# Patient Record
Sex: Female | Born: 1950 | Race: Black or African American | Hispanic: No | Smoking: Never smoker
Health system: Southern US, Community
[De-identification: ages and names within clinical notes are randomized; demographics above are authoritative.]

## PROBLEM LIST (undated history)

## (undated) DIAGNOSIS — M1990 Unspecified osteoarthritis, unspecified site: Secondary | ICD-10-CM

## (undated) DIAGNOSIS — C50919 Malignant neoplasm of unspecified site of unspecified female breast: Secondary | ICD-10-CM

## (undated) DIAGNOSIS — E039 Hypothyroidism, unspecified: Secondary | ICD-10-CM

## (undated) DIAGNOSIS — Z9221 Personal history of antineoplastic chemotherapy: Secondary | ICD-10-CM

## (undated) DIAGNOSIS — I10 Essential (primary) hypertension: Secondary | ICD-10-CM

## (undated) DIAGNOSIS — I519 Heart disease, unspecified: Secondary | ICD-10-CM

## (undated) DIAGNOSIS — E7800 Pure hypercholesterolemia, unspecified: Secondary | ICD-10-CM

## (undated) DIAGNOSIS — Z923 Personal history of irradiation: Secondary | ICD-10-CM

## (undated) HISTORY — DX: Heart disease, unspecified: I51.9

## (undated) HISTORY — DX: Malignant neoplasm of unspecified site of unspecified female breast: C50.919

## (undated) HISTORY — DX: Pure hypercholesterolemia, unspecified: E78.00

## (undated) HISTORY — PX: COLONOSCOPY: SHX174

---

## 1998-08-11 ENCOUNTER — Other Ambulatory Visit: Admission: RE | Admit: 1998-08-11 | Discharge: 1998-08-11 | Payer: Self-pay | Admitting: Obstetrics and Gynecology

## 1999-03-13 HISTORY — PX: BREAST LUMPECTOMY: SHX2

## 1999-08-16 ENCOUNTER — Encounter (HOSPITAL_BASED_OUTPATIENT_CLINIC_OR_DEPARTMENT_OTHER): Payer: Self-pay | Admitting: General Surgery

## 1999-08-18 ENCOUNTER — Ambulatory Visit (HOSPITAL_COMMUNITY): Admission: RE | Admit: 1999-08-18 | Discharge: 1999-08-18 | Payer: Self-pay | Admitting: General Surgery

## 1999-08-18 ENCOUNTER — Encounter (INDEPENDENT_AMBULATORY_CARE_PROVIDER_SITE_OTHER): Payer: Self-pay | Admitting: Specialist

## 1999-08-21 ENCOUNTER — Other Ambulatory Visit: Admission: RE | Admit: 1999-08-21 | Discharge: 1999-08-21 | Payer: Self-pay | Admitting: General Surgery

## 1999-09-06 ENCOUNTER — Encounter (INDEPENDENT_AMBULATORY_CARE_PROVIDER_SITE_OTHER): Payer: Self-pay | Admitting: *Deleted

## 1999-09-06 ENCOUNTER — Ambulatory Visit (HOSPITAL_COMMUNITY): Admission: RE | Admit: 1999-09-06 | Discharge: 1999-09-06 | Payer: Self-pay | Admitting: General Surgery

## 1999-10-10 ENCOUNTER — Encounter (HOSPITAL_BASED_OUTPATIENT_CLINIC_OR_DEPARTMENT_OTHER): Payer: Self-pay | Admitting: General Surgery

## 1999-10-10 ENCOUNTER — Ambulatory Visit (HOSPITAL_COMMUNITY): Admission: RE | Admit: 1999-10-10 | Discharge: 1999-10-10 | Payer: Self-pay | Admitting: General Surgery

## 1999-12-19 ENCOUNTER — Ambulatory Visit (HOSPITAL_COMMUNITY): Admission: RE | Admit: 1999-12-19 | Discharge: 1999-12-19 | Payer: Self-pay | Admitting: General Surgery

## 2000-01-04 ENCOUNTER — Encounter: Admission: RE | Admit: 2000-01-04 | Discharge: 2000-04-03 | Payer: Self-pay | Admitting: Radiation Oncology

## 2000-08-22 ENCOUNTER — Other Ambulatory Visit: Admission: RE | Admit: 2000-08-22 | Discharge: 2000-08-22 | Payer: Self-pay | Admitting: Obstetrics and Gynecology

## 2000-11-27 ENCOUNTER — Ambulatory Visit (HOSPITAL_COMMUNITY): Admission: RE | Admit: 2000-11-27 | Discharge: 2000-11-27 | Payer: Self-pay | Admitting: Gastroenterology

## 2002-10-20 ENCOUNTER — Encounter: Admission: RE | Admit: 2002-10-20 | Discharge: 2002-10-20 | Payer: Self-pay | Admitting: Oncology

## 2002-10-20 ENCOUNTER — Encounter: Payer: Self-pay | Admitting: Oncology

## 2003-03-13 HISTORY — PX: MENISCUS REPAIR: SHX5179

## 2003-05-20 ENCOUNTER — Ambulatory Visit (HOSPITAL_COMMUNITY): Admission: RE | Admit: 2003-05-20 | Discharge: 2003-05-20 | Payer: Self-pay | Admitting: Cardiovascular Disease

## 2003-06-22 ENCOUNTER — Encounter: Admission: RE | Admit: 2003-06-22 | Discharge: 2003-06-22 | Payer: Self-pay | Admitting: Cardiovascular Disease

## 2003-07-06 ENCOUNTER — Encounter: Admission: RE | Admit: 2003-07-06 | Discharge: 2003-07-06 | Payer: Self-pay | Admitting: Oncology

## 2004-06-16 ENCOUNTER — Ambulatory Visit: Payer: Self-pay | Admitting: Oncology

## 2004-07-20 ENCOUNTER — Encounter: Admission: RE | Admit: 2004-07-20 | Discharge: 2004-07-20 | Payer: Self-pay | Admitting: Obstetrics and Gynecology

## 2004-09-01 ENCOUNTER — Ambulatory Visit (HOSPITAL_COMMUNITY): Admission: RE | Admit: 2004-09-01 | Discharge: 2004-09-01 | Payer: Self-pay | Admitting: Obstetrics and Gynecology

## 2004-09-01 ENCOUNTER — Encounter (INDEPENDENT_AMBULATORY_CARE_PROVIDER_SITE_OTHER): Payer: Self-pay | Admitting: Specialist

## 2005-04-16 ENCOUNTER — Ambulatory Visit: Payer: Self-pay | Admitting: Oncology

## 2005-06-12 ENCOUNTER — Ambulatory Visit: Payer: Self-pay | Admitting: Oncology

## 2005-08-30 ENCOUNTER — Encounter: Admission: RE | Admit: 2005-08-30 | Discharge: 2005-08-30 | Payer: Self-pay | Admitting: Obstetrics and Gynecology

## 2006-09-02 ENCOUNTER — Encounter: Admission: RE | Admit: 2006-09-02 | Discharge: 2006-09-02 | Payer: Self-pay | Admitting: General Surgery

## 2007-09-10 ENCOUNTER — Encounter: Admission: RE | Admit: 2007-09-10 | Discharge: 2007-09-10 | Payer: Self-pay | Admitting: Cardiovascular Disease

## 2007-10-19 ENCOUNTER — Encounter: Admission: RE | Admit: 2007-10-19 | Discharge: 2007-10-19 | Payer: Self-pay | Admitting: Cardiovascular Disease

## 2008-09-14 ENCOUNTER — Encounter: Admission: RE | Admit: 2008-09-14 | Discharge: 2008-09-14 | Payer: Self-pay | Admitting: Cardiovascular Disease

## 2009-03-09 ENCOUNTER — Ambulatory Visit (HOSPITAL_COMMUNITY): Admission: RE | Admit: 2009-03-09 | Discharge: 2009-03-09 | Payer: Self-pay | Admitting: Obstetrics and Gynecology

## 2009-09-21 ENCOUNTER — Encounter: Admission: RE | Admit: 2009-09-21 | Discharge: 2009-09-21 | Payer: Self-pay | Admitting: Obstetrics and Gynecology

## 2009-12-19 ENCOUNTER — Encounter: Admission: RE | Admit: 2009-12-19 | Discharge: 2009-12-19 | Payer: Self-pay | Admitting: Obstetrics and Gynecology

## 2010-05-23 ENCOUNTER — Other Ambulatory Visit: Payer: Self-pay | Admitting: Obstetrics and Gynecology

## 2010-06-12 LAB — CBC
HCT: 42.3 % (ref 36.0–46.0)
Hemoglobin: 14 g/dL (ref 12.0–15.0)
MCV: 89.3 fL (ref 78.0–100.0)
Platelets: 198 10*3/uL (ref 150–400)
WBC: 5.4 10*3/uL (ref 4.0–10.5)

## 2010-07-28 NOTE — Procedures (Signed)
. [facility]  Patient:    Rebecca Morgan, Rebecca Morgan Visit Number: [id number] MRN: [id number]          Service Type: END Location: ENDO Attending Physician:  Charna Elizabeth Dictated by:   Anselmo Rod, M.D. Proc. Date: 11/27/00 Admit Date:  11/27/2000   CC:         Ricki Rodriguez, M.D.   Procedure Report  DATE OF BIRTH:  June 10, 1950.  PROCEDURE:  Colonoscopy.  ENDOSCOPIST:  Anselmo Rod, M.D.  INSTRUMENT USED:  Olympus video colonoscope.  INDICATION FOR PROCEDURE:  A 60 year old African-American female with a personal history of breast cancer.  Rule out colonic polyps, masses, hemorrhoids, etc.  The patient has guaiac-positive stools.  PREPROCEDURE PREPARATION:  Informed consent was procured from the patient. The patient was fasted for eight hours prior to the procedure and prepped with a bottle of magnesium citrate and a gallon of NuLytely the night prior to the procedure.  PREPROCEDURE PHYSICAL:  VITAL SIGNS:  The patient had stable vital signs.  NECK:  Supple.  CHEST:  Clear to auscultation.  S1, S2 regular.  ABDOMEN:  Soft with normal bowel sounds.  DESCRIPTION OF PROCEDURE:  The patient was placed in the left lateral decubitus position and sedated with 75 mg of Demerol and 7.5 mg of Versed intravenously.  Once the patient was adequately sedate and maintained on low-flow oxygen and continuous cardiac monitoring, the Olympus video colonoscope was advanced from the rectum to the cecum without difficulty. Except for a few early left-sided diverticula, no other masses were seen.  The patient tolerated the procedure well without complications.  The appendiceal orifice and the ileocecal valve were clearly visualized and photographed.  IMPRESSION:  Healthy-appearing colon except for a few early left-sided diverticula.  RECOMMENDATIONS: 1. Repeat guaiac testing will be done on an outpatient basis and    recommendations made  thereafter. 2. High-fiber diet with liberal fluid intake has been advocated.  Brochures    on diverticulosis have been handed to the patient for her education. 3. Considering her personal history of breast cancer, repeat colorectal cancer    screening is recommended in the next five years unless the patient were to    develop an abnormal symptoms in the interim. Dictated by:   Anselmo Rod, M.D. Attending Physician:  Charna Elizabeth DD:  11/27/00 TD:  11/27/00 Job: [id number]

## 2010-07-28 NOTE — Op Note (Signed)
[place]. [facility]  Patient:    Rebecca Morgan, Rebecca Morgan                         MRN: [id number] Proc. Date: 09/06/99 Adm. Date:  [id number] Disc. Date: [id number] Attending:  Sonda Primes                           Operative Report  PREOPERATIVE DIAGNOSIS:  Carcinoma of the right breast.  POSTOPERATIVE DIAGNOSIS:   Carcinoma of the right breast.  PROCEDURE:  Sentinel lymph node dissection.  SURGEON:  Mardene Celeste. Lurene Shadow, M.D.  ASSISTANT:  ANESTHESIA:  General.  INDICATIONS:  This patient is a 60 year old woman who underwent excision of a right breast mass with margins.  Pathology shows that this is a carcinoma measuring approximately 2.5 cm.  Margins are widely free of tumor.  She is returned to the operating room now following radionuclide injection into the previous biopsy site and injection of Lymphazurin dye here in the operating room for sentinel lymph node dissection.  DESCRIPTION OF PROCEDURE:  Following the induction of anesthesia with the patient positioned supinely, the right axilla and breast are prepped and draped to be included in the sterile operative field.  Using the needle probe to locate an area just at the right axillary line where the counts were highest, I made a transverse incision across the axilla, deepened this through the skin and subcutaneous tissues, using the needle probe to guide the way to the radioactivity.  In the area dissected, there was a very large, blue, hot lymph node which was dissected free using electrocautery and clips where appropriate.  There were three separate nodes forwarded for pathologic evaluation, #1, 2, and 3.  All of these on touch prep were noted to be free of tumor.  Hemostasis was assured within the axilla.  The sponge, instrument, and sharp counts were verified.  The subcutaneous tissues were closed with interrupted 3-0 Vicryl suture.  The skin was closed with 4-0 Monocryl suture and then  reinforced with Steri-Strips.  Sterile dressings were applied. Anesthetic reversed.  The patient was removed from the operating room to the recovery room in stable condition, having tolerated the procedure well. DD:  09/06/99 TD:  09/07/99 Job: [id number]

## 2010-07-28 NOTE — Op Note (Signed)
[place]. [facility]  Patient:    NAYLEE, FRANKOWSKI                         MRN: [id number] Proc. Date: 08/18/99 Adm. Date:  [id number] Disc. Date: [id number] Attending:  Sonda Primes CC:         Mardene Celeste. Lurene Shadow, M.D. (2)                           Operative Report  PREOPERATIVE DIAGNOSIS:  Right breast mass.  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  OPERATION PERFORMED:  Excisional biopsy right breast mass followed by lumpectomy of right breast.  SURGEON:  Luisa Hart L. Lurene Shadow, M.D.  ASSISTANTRubye Oaks,  PA student.  ANESTHESIA:  General.  INDICATIONS FOR PROCEDURE:  The patient is a 60 year old woman presenting with a palpable right breast mass which is not imaged on mammogram or ultrasound. She is brought to the operating room for excisional biopsy.  DESCRIPTION OF PROCEDURE:  Following the induction of satisfactory anesthesia, the patient was positioned supinely and the right breast prepped and draped to be included in a sterile operative field.  A circumareolar incision was made on the inferior and lateral portion of the areolar border, deepened through the skin and subcutaneous tissues with dissection carried somewhat inferiorly and laterally to the mass.  The mass was dissected free and forwarded for pathologic evaluation.  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  The patient was prepared with extension of the incisions both inferiorly and superiorly and a lumpectomy taking wide margins around the biopsy cavity down to the chest wall were taken.  Lumpectomy specimen marked with sutures for orientation.  Single long suture medial portion, double suture inferior portion and four sutures on the anterior margin of the lumpectomy.  This was forwarded for pathologic evaluation. Pathology is pending.  Hemostasis was assured throughout the breast tissues. Sponge, instrument and  sharp counts were verified.  Subcutaneous tissues closed with interrupted 3-0 Vicryl sutures and skin closed with 5-0 Monocryl running subcuticular stitches, then reinforced with Steri-Strips.  Sterile dressings applied.  Anesthetic reversed.  Patient removed from the operating room to the recovery room in stable condition having tolerated the procedure well.DD:  08/18/99 TD:  08/22/99 Job: [id number]

## 2010-07-28 NOTE — Op Note (Signed)
[place]. [facility]  Patient:    Rebecca Morgan, Rebecca Morgan                         MRN: [id number] Proc. Date: 10/10/99 Attending:  Luisa Hart L. Lurene Shadow, M.D. CC:         Mardene Celeste. Lurene Shadow, M.D. 2 copies                           Operative Report  PREOPERATIVE DIAGNOSIS:  Poor venous access status post breast cancer for chemotherapy.  POSTOPERATIVE DIAGNOSIS:  Poor venous access status post breast cancer for chemotherapy.  PROCEDURE:  Implantation of Port-A-Cath.  SURGEON:  Dr. Lurene Shadow.  ASSISTANT:  Nurse.  ANESTHESIA:  MAC.  I used 1% Xylocaine with and without epinephrine.  HISTORY OF PRESENT ILLNESS:  This patient is a 60 year old woman being prepared for treatment for advanced breast cancer who has poor venous access. She is brought to the operating room now for implantation of venous access device.  DESCRIPTION OF PROCEDURE:  Following the induction of satisfactory sedation, with the patient positioned in Trendelenburg position, the anterior neck and chest were prepped and draped to be included in the sterile operative field. Infiltrated the left subclavian region with 1% Xylocaine plain, and a subclavian stick was made into the left subclavian vein, and a guide wire is threaded into the central venous system and positioned in the atrium under fluoroscopic guidance.  I then created a pocket on the anterior chest wall, and from the pocket created a tunnel up to the shoulder wound.  A Silastic catheter was pulled through the tunnel and flushed.  I used a size 10 French Cook introducer dilator to access the central venous system over the guide wire.  The dilator and guide wire were removed, and the Silastic catheter was then placed into the central venous system through the [facility] introducer, and positioned at the atrial vena caval junction.  Inflow of heparinized saline and then blood returned and was noted to be excellent.  The exterior portion of the Silastic  catheter was then cut, and the reservoir was flushed and attached to the Silastic catheter.  Inflow of heparinized saline and blood return were noted to be excellent through the reservoir.  The reservoir was then positioned in the pocket, and then sutured in place with interrupted 2-0 silk sutures.  Sponge, instrument, and Sharps counts were then verified. Final positioning fluoroscopically showed the reservoir to be well suited.  No kinks, bends, or turns within the course of the Silastic tubing, and the tip of the tubing was at the atrial vena caval junction.  Subcutaneous tissues of the wound were then closed with interrupted 3-0 Vicryl sutures, both the pocket and the shoulder wound, and the skin wounds were both closed with a running 4-0 Monocryl suture.  The wounds were reinforced with Steri-Strips, and a sterile dressing applied.  Anesthetic reversed, and the patient removed from the operating room to the recovery room in stable condition, having tolerated the procedure well.DD:  10/10/99 TD:  10/10/99 Job: [id number]

## 2010-07-28 NOTE — Op Note (Signed)
NAME:  Rebecca Morgan, Rebecca Morgan                ACCOUNT NO.:  [id number]   MEDICAL RECORD NO.:  [id number]          PATIENT TYPE:  AMB   LOCATION:  [facility]                           FACILITY:  [facility]   PHYSICIAN:  Maxie Better, M.D.DATE OF BIRTH:  1950/12/31   DATE OF PROCEDURE:  09/01/2004  DATE OF DISCHARGE:                                 OPERATIVE REPORT   PREOPERATIVE DIAGNOSES:  1.  Postmenopausal bleeding.  2.  Post coital bleeding.  3.  Endometrial mass.   OPERATION/PROCEDURE:  1.  Fractional dilatation and curettage.  2.  Diagnostic hysteroscopy.  3.  Resection of submucosal fibroids.   POSTOPERATIVE DIAGNOSES:  1.  Submucosal fibroid.  2.  Post coital bleeding.   ANESTHESIA:  General.   SURGEON:  Maxie Better, M.D.   INDICATIONS:  This is a 60 year old, para 2, postmenopausal female with a  history of right breast cancer on tamoxifen therapy who presented with  complaint of post coital bleeding, and who, on evaluation with a  sonohysterogram and ultrasound, was found to have a hyperechoic mass  posteriorly.  The patient now presents for surgical management.  She is  known to have additional uterine fibroids by ultrasound and endometrial  stripe at been 7.2  mm prior to the sonohysterogram.  The risks and benefits  of the procedure had been explained to the patient.  The patient was  transferred to the operating room.   DESCRIPTION OF PROCEDURE:  Under adequate general anesthesia, the patient  was placed in the dorsal lithotomy position.  She was sterilely prepped and  draped in the usual fashion.  Bladder was catheterized for a moderate amount  of urine.  Examination under anesthesia revealed an irregular 10-week size  uterus.  No adnexal masses could be appreciated but limited by the patient's  body habitus.   Speculum was placed in the vagina.  Single-tooth tenaculum was placed no the  anterior lip of the cervix.  The endocervical canal was then curetted for  specimen of simple curettage/curettings.  The cervix was then serially  dilated up to #31 [facility] dilator. A sorbitol prime resectoscope was  introduced into the uterine cavity without incident.  On inspection, there  was a posterior mass noted close to the lower uterine segment.  Both tubal  ostia could be seen.  The double loop was then used to resect the mass  which was found to be consistent with a submucosal fibroid.  On further  inspection of the endometrial cavity, there was a polypoid lesion extending  from the left tubal ostia which was carefully removed.  The resectoscope was  then slowly removed.  The endocervical canal was inspected.  No polyps were  noted.  The resectoscope was then removed entirely and the endometrial  cavity was then curetted for  scant tissue.  Specimen labeled endometrial curettings, endocervical  curettings and submucosal fibroid resection was all sent to pathology.  Fluid deficit was 100 mL.  Complications none.  Estimated blood loss was  minimal.  The patient tolerated the procedure well, was transferred to the  recovery room in satisfactory condition.  [place]/MEDQ  D:  09/01/2004  T:  09/01/2004  Job:  [id number]

## 2010-10-06 ENCOUNTER — Other Ambulatory Visit: Payer: Self-pay | Admitting: Obstetrics and Gynecology

## 2010-10-06 DIAGNOSIS — Z1231 Encounter for screening mammogram for malignant neoplasm of breast: Secondary | ICD-10-CM

## 2010-10-11 ENCOUNTER — Ambulatory Visit
Admission: RE | Admit: 2010-10-11 | Discharge: 2010-10-11 | Disposition: A | Discharge status: Home / Self Care | Payer: BC Managed Care – PPO | Source: Ambulatory Visit | Attending: Obstetrics and Gynecology | Admitting: Obstetrics and Gynecology

## 2010-10-11 DIAGNOSIS — Z1231 Encounter for screening mammogram for malignant neoplasm of breast: Secondary | ICD-10-CM

## 2011-11-01 ENCOUNTER — Other Ambulatory Visit: Payer: Self-pay | Admitting: Obstetrics and Gynecology

## 2011-11-01 DIAGNOSIS — Z1231 Encounter for screening mammogram for malignant neoplasm of breast: Secondary | ICD-10-CM

## 2011-11-02 ENCOUNTER — Ambulatory Visit
Admission: RE | Admit: 2011-11-02 | Discharge: 2011-11-02 | Disposition: A | Discharge status: Home / Self Care | Payer: 59 | Source: Ambulatory Visit | Attending: Obstetrics and Gynecology | Admitting: Obstetrics and Gynecology

## 2011-11-02 DIAGNOSIS — Z1231 Encounter for screening mammogram for malignant neoplasm of breast: Secondary | ICD-10-CM

## 2012-05-29 ENCOUNTER — Other Ambulatory Visit: Payer: Self-pay | Admitting: *Deleted

## 2012-07-31 ENCOUNTER — Other Ambulatory Visit: Payer: Self-pay

## 2012-09-18 ENCOUNTER — Institutional Professional Consult (permissible substitution): Payer: Self-pay | Admitting: Neurology

## 2012-09-23 ENCOUNTER — Other Ambulatory Visit: Payer: Self-pay

## 2012-09-23 DIAGNOSIS — Z1231 Encounter for screening mammogram for malignant neoplasm of breast: Secondary | ICD-10-CM

## 2012-09-26 ENCOUNTER — Institutional Professional Consult (permissible substitution): Payer: Self-pay | Admitting: Neurology

## 2012-10-07 ENCOUNTER — Encounter: Payer: Self-pay | Admitting: Neurology

## 2012-10-07 ENCOUNTER — Ambulatory Visit (INDEPENDENT_AMBULATORY_CARE_PROVIDER_SITE_OTHER): Payer: 59 | Admitting: Neurology

## 2012-10-07 VITALS — BP 153/87 | HR 72 | Ht 65.0 in | Wt 284.0 lb

## 2012-10-07 DIAGNOSIS — J988 Other specified respiratory disorders: Secondary | ICD-10-CM

## 2012-10-07 DIAGNOSIS — E669 Obesity, unspecified: Secondary | ICD-10-CM

## 2012-10-07 DIAGNOSIS — G479 Sleep disorder, unspecified: Secondary | ICD-10-CM

## 2012-10-07 DIAGNOSIS — I10 Essential (primary) hypertension: Secondary | ICD-10-CM

## 2012-10-07 DIAGNOSIS — R0609 Other forms of dyspnea: Secondary | ICD-10-CM

## 2012-10-07 DIAGNOSIS — R0683 Snoring: Secondary | ICD-10-CM

## 2012-10-07 NOTE — Progress Notes (Signed)
Subjective:    Patient ID: Rebecca Morgan is a 62 y.o. female.  HPI  Huston Foley, MD, PhD [facility] [address], [address] [address] [place], [place] [address]  Dear Dr. Earlene Plater,   Saw your patient, Rebecca Morgan, upon your kind request in my neurologic clinic today for initial consultation of her sleep disorder, in particular concerning for obstructive sleep apnea. The patient is unaccompanied today. As you know, Rebecca Morgan is a very pleasant 62 year old right-handed woman with an underlying medical history of hypertension, and thyroid disease who recently was in your office and reported daytime somnolence with an Epworth sleepiness score of 20, as well as snoring.  Her typical bedtime is not set and she may go to bed at MN and it takes her longer than 30 minutes to fall asleep on an average night. She endorses rumination of thought. She wakes up multiple times per night and goes to the bathroom about 2 times per night. She feels she never gets into a deep sleep. She wakes up between 6:30 and 6:45 AM for work and when she first wakes up she feels fairly rested but in the late morning, she becomes very sleepy. She has dozed off at work. She has dozed off at the light, but has put the car in "park" before. She denies morning headaches. Her sister was diagnosed with OSA, and had a CPAP machine. Her father had significant sleepiness during the day.  She reports excessive daytime somnolence (EDS) and Her Epworth Sleepiness Score (ESS) is 21/24 today. She has not fallen asleep while driving. The patient has been taking a planned nap, which is usually 30 minutes long at lunch time in her car. 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 She reports dreaming in a nap and reports feeling refreshed after a nap.  He has been known to snore for the past many years. Snoring is reportedly mild, and not associated with choking sounds and witnessed apneas. The patient tends to wake  up from her own snoring but denies a sense of choking or strangling feeling. There is no report of nighttime reflux, with no nighttime cough experienced. The patient has not noted any RLS symptoms and is not known to kick while asleep or before falling asleep. She is a restless sleeper and in the morning, the bed is quite disheveled.   She denies cataplexy, sleep paralysis, hypnagogic or hypnopompic hallucinations, or sleep attacks. She does not report any vivid dreams, nightmares, dream enactments, or parasomnias, such as sleep talking or sleep walking. The patient has not had a sleep study or a home sleep test.  She consumes 2 caffeinated beverage per day, usually in the form of coffee and sweet tea. She has tried [place] for sleep at night, which did not help.    Her Past Medical History Is Significant For: Past Medical History  Diagnosis Date  . High cholesterol   . Heart disease   . Cancer of breast     Her Past Surgical History Is Significant For: Past Surgical History  Procedure Laterality Date  . Breast lumpectomy  2001  . Meniscus repair Right 2005    Her Family History Is Significant For: Family History  Problem Relation Age of Onset  . Cancer Mother   . Stroke Father     Her Social History Is Significant For: History   Social History  . Marital Status: Divorced    Spouse Name: N/A  Number of Children: 2  . Years of Education: college   Occupational History  . [employer]    Social History Main Topics  . Smoking status: None  . Smokeless tobacco: None  . Alcohol Use: No  . Drug Use: No  . Sexually Active: None   Other Topics Concern  . None   Social History Narrative  . None    Her Allergies Are:  No Known Allergies:   Her Current Medications Are:  Outpatient Encounter Prescriptions as of 10/07/2012  Medication Sig Dispense Refill  . amLODipine (NORVASC) 2.5 MG tablet       . levothyroxine (SYNTHROID, LEVOTHROID) 150 MCG tablet       .  losartan (COZAAR) 100 MG tablet       . metoprolol succinate (TOPROL-XL) 50 MG 24 hr tablet        No facility-administered encounter medications on file as of 10/07/2012.  : Review of Systems  Constitutional: Positive for fatigue.  Eyes:       Blurred Vision  Musculoskeletal:       Swelling in legs, Joint pain  Psychiatric/Behavioral: Positive for sleep disturbance.       Racing Thoughts, Decrease Energy, Insomnia    Objective:  Neurologic Exam  Physical Exam Physical Examination:   Filed Vitals:   10/07/12 0846  BP: 153/87  Pulse: 72    General Examination: The patient is a very pleasant 62 y.o. female in no acute distress. She appears well-developed and well-nourished and well groomed.   HEENT: Normocephalic, atraumatic, pupils are equal, round and reactive to light and accommodation. Funduscopic exam is normal with sharp disc margins noted. Extraocular tracking is good without limitation to gaze excursion or nystagmus noted. Normal smooth pursuit is noted. Hearing is grossly intact. Tympanic membranes are clear bilaterally. Face is symmetric with normal facial animation and normal facial sensation. Speech is clear with no dysarthria noted. There is no hypophonia. There is no lip, neck/head, jaw or voice tremor. Neck is supple with full range of passive and active motion. There are no carotid bruits on auscultation. Oropharynx exam reveals: mild mouth dryness, adequate dental hygiene and moderate airway crowding, due to elongated tongue and narrow airway entry. Mallampati is class II. Tongue protrudes centrally and palate elevates symmetrically. Tonsils are 1+. Neck size is 16 inches.   Chest: Clear to auscultation without wheezing, rhonchi or crackles noted.  Heart: S1+S2+0, regular and normal without murmurs, rubs or gallops noted.   Abdomen: Soft, non-tender and non-distended with normal bowel sounds appreciated on auscultation.  Extremities: There is 2+ pitting edema in the  distal lower extremities bilaterally, R>L. Pedal pulses are intact.  Skin: Warm and dry without trophic changes noted. There are no varicose veins.  Musculoskeletal: exam reveals no obvious joint deformities, tenderness or joint swelling or erythema.   Neurologically:  Mental status: The patient is awake, alert and oriented in all 4 spheres. Her memory, attention, language and knowledge are appropriate. There is no aphasia, agnosia, apraxia or anomia. Speech is clear with normal prosody and enunciation. Thought process is linear. Mood is congruent and affect is normal.  Cranial nerves are as described above under HEENT exam. In addition, shoulder shrug is normal with equal shoulder height noted. Motor exam: Normal bulk, strength and tone is noted. There is no drift, tremor or rebound. Romberg is negative. Reflexes are 2+ throughout. Toes are downgoing bilaterally. Fine motor skills are intact with normal finger taps, normal hand movements, normal rapid alternating patting, normal  foot taps and normal foot agility.  Cerebellar testing shows no dysmetria or intention tremor on finger to nose testing. Heel to shin is unremarkable bilaterally. There is no truncal or gait ataxia.  Sensory exam is intact to light touch, pinprick, vibration, temperature sense and proprioception in the upper and lower extremities.  Gait, station and balance are unremarkable. No veering to one side is noted. No leaning to one side is noted. Posture is age-appropriate and stance is narrow based. No problems turning are noted. She turns en bloc. 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 Intact toe stance, but has trouble with heel stance.               Assessment and Plan:   In summary, Rebecca Morgan is a very pleasant 62 y.o.-year old female with a history and physical exam concerning for obstructive sleep apnea (OSA). I had a long chat with  the patient about my findings and the diagnosis, its prognosis and treatment options. We talked about medical treatments and non-pharmacological approaches. I explained in particular the risks and ramifications of untreated moderate to severe OSA, especially with respect to developing cardiovascular disease down the Road, including congestive heart failure, difficult to treat hypertension, cardiac arrhythmias, or stroke. Even type 2 diabetes has in part been linked to untreated OSA. We talked about cessation and trying to maintain a healthy lifestyle in general, as well as the importance of weight control. I encouraged the patient to eat healthy, exercise daily and keep well hydrated, to keep a scheduled bedtime and wake time routine, to not skip any meals and eat healthy snacks in between meals.  I recommended the following at this time: sleep study with potential CPAP titration.  I explained the sleep test procedure to the patient and also outlined surgical and non-surgical treatment options of OSA including the use of a dental custom-made appliance, upper airway surgery such as pillar implants, radiofrequency surgery, tongue base surgery, and UPPP. I also explained the CPAP treatment option to the patient, who indicated that she would be willing to try CPAP if the need arises. I explained the importance of being compliant with PAP treatment, not only for insurance purposes but primarily for the patient's long term health benefit. I answered all her questions today and the patient was in agreement. I would like to see her back after the sleep study is completed and encouraged her to call with any interim questions, concerns, problems or updates.   Thank you very much for allowing me to participate in the care of this nice patient. If I can be of any further assistance to you please do not hesitate to call me at [phone].  Sincerely,   Huston Foley, MD, PhD

## 2012-10-07 NOTE — Patient Instructions (Signed)

## 2012-10-31 ENCOUNTER — Ambulatory Visit (INDEPENDENT_AMBULATORY_CARE_PROVIDER_SITE_OTHER): Payer: 59 | Admitting: Neurology

## 2012-10-31 VITALS — BP 130/70

## 2012-10-31 DIAGNOSIS — R0683 Snoring: Secondary | ICD-10-CM

## 2012-10-31 DIAGNOSIS — G4761 Periodic limb movement disorder: Secondary | ICD-10-CM

## 2012-10-31 DIAGNOSIS — E669 Obesity, unspecified: Secondary | ICD-10-CM

## 2012-10-31 DIAGNOSIS — G4733 Obstructive sleep apnea (adult) (pediatric): Secondary | ICD-10-CM

## 2012-10-31 DIAGNOSIS — G4713 Recurrent hypersomnia: Secondary | ICD-10-CM

## 2012-10-31 DIAGNOSIS — J988 Other specified respiratory disorders: Secondary | ICD-10-CM

## 2012-10-31 DIAGNOSIS — I10 Essential (primary) hypertension: Secondary | ICD-10-CM

## 2012-10-31 DIAGNOSIS — G479 Sleep disorder, unspecified: Secondary | ICD-10-CM

## 2012-10-31 DIAGNOSIS — R159 Full incontinence of feces: Secondary | ICD-10-CM

## 2012-11-03 ENCOUNTER — Ambulatory Visit
Admission: RE | Admit: 2012-11-03 | Discharge: 2012-11-03 | Disposition: A | Discharge status: Home / Self Care | Payer: 59 | Source: Ambulatory Visit

## 2012-11-03 DIAGNOSIS — Z1231 Encounter for screening mammogram for malignant neoplasm of breast: Secondary | ICD-10-CM

## 2012-11-12 NOTE — Sleep Study (Signed)
See media tab for full report  

## 2012-11-14 ENCOUNTER — Telehealth: Payer: Self-pay | Admitting: Neurology

## 2012-11-14 NOTE — Telephone Encounter (Signed)
Please call and notify the patient that the recent sleep study did not show any significant degree of obstructive sleep apnea, but she had significant kicking in her sleep which correlates highly with RLA and she also had early onset REM sleep, which is a finding that can be seen in patients with narcolepsy. I would like to discuss details and where to go from here with her during a FU appt. Pls help schedule FU with me.  Huston Foley, MD, PhD [facility])

## 2012-11-18 ENCOUNTER — Encounter: Payer: Self-pay | Admitting: *Deleted

## 2012-11-18 NOTE — Telephone Encounter (Signed)
Called patient to discuss sleep study results.  Discussed findings, recommendations and follow up care.  Patient understood well and all questions were answered.   Follow up appointment was scheduled for 12/08/2012 at 3:30 PM with Dr. Frances Furbish.  A copy of study was mailed to patient and faxed to PCP and Robbie Lis, DDS. -sh

## 2012-12-08 ENCOUNTER — Ambulatory Visit (INDEPENDENT_AMBULATORY_CARE_PROVIDER_SITE_OTHER): Payer: 59 | Admitting: Neurology

## 2012-12-08 ENCOUNTER — Encounter: Payer: Self-pay | Admitting: Neurology

## 2012-12-08 VITALS — BP 159/79 | HR 74 | Ht 66.0 in | Wt 280.0 lb

## 2012-12-08 DIAGNOSIS — E669 Obesity, unspecified: Secondary | ICD-10-CM

## 2012-12-08 DIAGNOSIS — G2581 Restless legs syndrome: Secondary | ICD-10-CM

## 2012-12-08 DIAGNOSIS — G4733 Obstructive sleep apnea (adult) (pediatric): Secondary | ICD-10-CM

## 2012-12-08 DIAGNOSIS — G4710 Hypersomnia, unspecified: Secondary | ICD-10-CM

## 2012-12-08 DIAGNOSIS — G4761 Periodic limb movement disorder: Secondary | ICD-10-CM

## 2012-12-08 MED ORDER — PRAMIPEXOLE DIHYDROCHLORIDE 0.125 MG PO TABS: ORAL_TABLET | ORAL | Status: DC

## 2012-12-08 NOTE — Progress Notes (Signed)
Subjective:    Patient ID: Rebecca Morgan is a 62 y.o. female.  HPI   Interim history:   Rebecca Morgan is a very pleasant 62 year old right-handed woman with an underlying medical history of hypertension, and thyroid disease who presents for followup consultation after a recent sleep study. I first met her on 10/07/2012 at the request of her primary care physician for concern for obstructive sleep apnea. I suggested a sleep study and I talked to her at length about her sleep test results from 10/31/2012. She has sleep efficiency near normal at 87.4% with a latency to sleep of 15 minutes and the wake after sleep onset of 50.5 minutes with moderate sleep fragmentation noted. There were increased percentages of stage I and 2 sleep, and decreased percentage of slow-wave sleep and a decreased percentage of REM sleep with a significantly reduced REM latency of 6 minutes. She had moderately severe periodic leg movements at 48 per hour with 9.6 arousals per hour. She had intermittent mild snoring. Her total AHI was 2 per hour rising to 11..7 per hour in REM sleep. Her baseline oxygen saturation was 92%, her nadir was 86% during REM sleep. She spent one hour below the saturation of 90%. At the time of her first visit she reported significant daytime somnolence. Her Epworth sleepiness score was 16 at the time of her sleep study but 21/24 at the time of her clinic visit with me. Her symptoms are somewhat worse, in that, her daytime sleepiness seems to be worse after lunch. Looking back, she recalls having symptoms of RLS some 20 years ago, when she was still residing in [place] and used to ride the bus. She remembers having had to move her legs and getting up in the bus to walk around.   Her Past Medical History Is Significant For: Past Medical History  Diagnosis Date  . High cholesterol   . Heart disease   . Cancer of breast     Her Past Surgical History Is Significant For: Past Surgical History  Procedure  Laterality Date  . Breast lumpectomy  2001  . Meniscus repair Right 2005    Her Family History Is Significant For: Family History  Problem Relation Age of Onset  . Cancer Mother   . Stroke Father     Her Social History Is Significant For: History   Social History  . Marital Status: Divorced    Spouse Name: N/A    Number of Children: 2  . Years of Education: college   Occupational History  . [employer]    Social History Main Topics  . Smoking status: Never Smoker   . Smokeless tobacco: Never Used  . Alcohol Use: No  . Drug Use: No  . Sexual Activity: None   Other Topics Concern  . None   Social History Narrative   Patient lives at home alone.   Caffeine Use: 1 cup daily    Her Allergies Are:  No Known Allergies:   Her Current Medications Are:  Outpatient Encounter Prescriptions as of 12/08/2012  Medication Sig Dispense Refill  . amLODipine (NORVASC) 2.5 MG tablet       . levothyroxine (SYNTHROID, LEVOTHROID) 150 MCG tablet       . losartan (COZAAR) 100 MG tablet       . metoprolol succinate (TOPROL-XL) 50 MG 24 hr tablet       . pramipexole (MIRAPEX) 0.125 MG tablet Take 1 pill each night for 1 week, the 2 pills each night for  1 week, then 3 pills each night thereafter.  90 tablet  3   No facility-administered encounter medications on file as of 12/08/2012.  : Review of Systems  Cardiovascular: Positive for leg swelling.  Musculoskeletal: Positive for joint swelling and arthralgias.  Skin:       moles  Psychiatric/Behavioral: Positive for sleep disturbance (Insomnia, sleepiness).       Not enough sleep Decreased energy    Objective:  Neurologic Exam  Physical Exam Physical Examination:   Filed Vitals:   12/08/12 1543  BP: 159/79  Pulse: 74    General Examination: The patient is a very pleasant 62 y.o. female in no acute distress. She appears well-developed and well-nourished and well groomed. She is obese.   HEENT: Normocephalic, atraumatic,  pupils are equal, round and reactive to light and accommodation. Extraocular tracking is good without limitation to gaze excursion or nystagmus noted. Normal smooth pursuit is noted. Hearing is grossly intact. Face is symmetric with normal facial animation and normal facial sensation. Speech is clear with no dysarthria noted. There is no hypophonia. There is no lip, neck/head, jaw or voice tremor. There are no carotid bruits on auscultation. Oropharynx exam reveals: mild mouth dryness, adequate dental hygiene and moderate airway crowding, due to elongated tongue and narrow airway entry. Mallampati is class II. Tongue protrudes centrally and palate elevates symmetrically. Tonsils are 1+.   Chest: Clear to auscultation without wheezing, rhonchi or crackles noted.  Heart: S1+S2+0, regular and normal without murmurs, rubs or gallops noted.   Abdomen: Soft, non-tender and non-distended with normal bowel sounds appreciated on auscultation.  Extremities: There is 2+ pitting edema in the distal lower extremities bilaterally, R>L. Pedal pulses are intact.  Skin: Warm and dry without trophic changes noted. There are no varicose veins.  Musculoskeletal: exam reveals no obvious joint deformities, tenderness or joint swelling or erythema.   Neurologically:  Mental status: The patient is awake, alert and oriented in all 4 spheres. Her memory, attention, language and knowledge are appropriate. There is no aphasia, agnosia, apraxia or anomia. Speech is clear with normal prosody and enunciation. Thought process is linear. Mood is congruent and affect is normal.  Cranial nerves are as described above under HEENT exam. In addition, shoulder shrug is normal with equal shoulder height noted. Motor exam: Normal bulk, strength and tone is noted. There is no drift, tremor or rebound. Romberg is negative. Reflexes are 2+ throughout. Toes are downgoing bilaterally. Fine motor skills are intact with normal finger taps, normal  hand movements, normal rapid alternating patting, normal foot taps and normal foot agility.  Cerebellar testing shows no dysmetria or intention tremor on finger to nose testing. Heel to shin is unremarkable bilaterally. There is no truncal or gait ataxia.  Sensory exam is intact to light touch.  Gait, station and balance are unremarkable. No veering to one side is noted. No leaning to one side is noted. Posture is age-appropriate and stance is narrow based. No problems turning are noted. She turns en bloc.              Assessment and Plan:   In summary, Rebecca Morgan is a very pleasant 62 year old female with a history of hypertension, obesity and hypothyroidism who has severe daytime somnolence. Her recent sleep study only showed mild REM-related obstructive sleep apnea. Her sleepiness seems certainly out of proportion to the degree of her underlying obstructive sleep apnea. In addition, she has moderate periodic leg movements of sleep with arousals that  actually disrupt her sleep. She does endorse some restless leg symptoms in the past but not recently. Nevertheless, I do believe it is worthwhile pursuing restless legs syndrome and PLMD treatment at this juncture. I cannot fully exclude narcolepsy or idiopathic hypersomnolence given the degree of her sleepiness. She denies cataplexy, sleep paralysis and hypnagogic or hypnopompic hallucinations. In addition, she had fairly quick onset and REM sleep during her nocturnal polysomnogram. I would like to pursue treatment of her leg movements and see if she improves in terms of consolidating her sleep and hopefully that may translate into better daytime energy and less daytime somnolence. I would like for her to get some blood work done to look for iron deficiency or anemia. She has a prior diagnosis of anemia. Her physical exam is stable and nonfocal. We will call her back with her blood test results. I would like for her to get started on Mirapex low-dose  starting at 0.125 mg strength with build up to 0.375 mg incrementally. I talked her about, and side effects as well as impulse control disorder. I would like to see her back in about 3 months and revisit her daytime somnolence. She is advised that if she has iron deficiency are low her ferritin level she may have to restart on iron supplements. We may also have to investigate her arm and deficiency at this age further. She understands this. I encouraged her to call with any interim questions, concerns, problems or updates. We talked at length about her sleep test results and her symptoms and most of our visit was spent in counseling and coordination of care.

## 2012-12-08 NOTE — Patient Instructions (Addendum)
I do believe you have symptoms of RLS (restless legs syndrome) and you had classic leg kicking in your sleep. I would like to check blood for iron deficiency and start you on a medication to help RLS symptoms, but also to calm down your legs at night. Start Mirapex (generic name: pramipexole) 0.125 mg: take about 90 minutes before your bedtime: Take 1 pill each night for 1 week, the 2 pills each night for 1 week, then 3 pills each night thereafter. Common side effects reported are: Sedation, sleepiness, nausea, vomiting, and rare side effects are confusion, hallucinations, swelling in legs, and abnormal behaviors, including impulse control problems, which can manifest as excessive eating, obsessions with food or gambling, or hypersexuality. Your sleep study showed moderate leg movements with brain arousals, disrupting your sleep, but mild REM sleep related sleep apnea only, for which pursuing weight loss may be all you need to do.

## 2012-12-11 ENCOUNTER — Other Ambulatory Visit (INDEPENDENT_AMBULATORY_CARE_PROVIDER_SITE_OTHER): Payer: Self-pay

## 2012-12-11 DIAGNOSIS — Z0289 Encounter for other administrative examinations: Secondary | ICD-10-CM

## 2012-12-12 LAB — COMPREHENSIVE METABOLIC PANEL
Albumin/Globulin Ratio: 1.5 (ref 1.1–2.5)
Albumin: 4 g/dL (ref 3.6–4.8)
BUN: 17 mg/dL (ref 8–27)
Calcium: 9.1 mg/dL (ref 8.6–10.2)
Creatinine, Ser: 0.82 mg/dL (ref 0.57–1.00)
GFR calc Af Amer: 87 mL/min/{1.73_m2} (ref >59)
GFR calc non Af Amer: 76 mL/min/{1.73_m2} (ref >59)
Glucose: 91 mg/dL (ref 65–99)
Potassium: 4.2 mmol/L (ref 3.5–5.2)
Total Bilirubin: 0.6 mg/dL (ref 0.0–1.2)
Total Protein: 6.6 g/dL (ref 6.0–8.5)

## 2012-12-12 LAB — IRON AND TIBC
Iron Saturation: 19 % (ref 15–55)
Iron: 46 ug/dL (ref 35–155)
TIBC: 244 ug/dL — ABNORMAL LOW (ref 250–450)
UIBC: 198 ug/dL (ref 150–375)

## 2012-12-12 LAB — VITAMIN D 25 HYDROXY (VIT D DEFICIENCY, FRACTURES): Vit D, 25-Hydroxy: 30.6 ng/mL (ref 30.0–100.0)

## 2012-12-12 LAB — CBC WITH DIFFERENTIAL
Basos: 1 %
Eos: 3 %
Eosinophils Absolute: 0.2 10*3/uL (ref 0.0–0.4)
HCT: 39.7 % (ref 34.0–46.6)
Hemoglobin: 13.4 g/dL (ref 11.1–15.9)
Immature Grans (Abs): 0 10*3/uL (ref 0.0–0.1)
Lymphs: 37 %
MCV: 84 fL (ref 79–97)
Monocytes: 9 %
Neutrophils Relative %: 50 %

## 2012-12-16 NOTE — Progress Notes (Signed)
Quick Note:  Please advise patient that her labs were for the most part unremarkable. She has no anemia. She has a borderline increased diabetes marker called hemoglobin A1c. She may be at risk for diabetes. Her vitamin D level was borderline and she may benefit from starting an over-the-counter supplement her vitamin D.  Huston Foley, MD, PhD [facility] ([facility])  ______

## 2012-12-16 NOTE — Progress Notes (Signed)
Quick Note:  Left a message on the patient's vm relaying the results of their recent labs. Contact information was also given so that the patient may call back if they have any questions. ______ 

## 2013-04-09 ENCOUNTER — Ambulatory Visit: Payer: 59 | Admitting: Neurology

## 2013-05-19 ENCOUNTER — Other Ambulatory Visit: Payer: Self-pay | Admitting: Obstetrics and Gynecology

## 2013-06-01 NOTE — Patient Instructions (Addendum)
   Your procedure is scheduled on: Friday, Mar 27  Enter through the [employer] [facility] at: [place] up the phone at the desk and dial [phone] and inform us of your arrival.  Please call this number if you have any problems the morning of surgery: [phone]  Remember: Do not eat food after midnight: Thursday Do not drink clear liquids after: 10 AM Friday, day of surgery Take these medicines the morning of surgery prior to 10 AM. amlodipine, levothyroxine, losartan, metoprolol  Do not wear jewelry, make-up, or FINGER nail polish No metal in your hair or on your body. Do not wear lotions, powders, perfumes.  You may wear deodorant.  Do not bring valuables to the hospital. Contacts, dentures or bridgework may not be worn into surgery.  Patients discharged on the day of surgery will not be allowed to drive home.  Home with - to be arranged prior to surgery.

## 2013-06-02 ENCOUNTER — Encounter (HOSPITAL_COMMUNITY)
Admission: RE | Admit: 2013-06-02 | Discharge: 2013-06-02 | Disposition: A | Discharge status: Home / Self Care | Payer: BC Managed Care – PPO | Source: Ambulatory Visit | Attending: Obstetrics and Gynecology | Admitting: Obstetrics and Gynecology

## 2013-06-02 ENCOUNTER — Encounter (HOSPITAL_COMMUNITY): Payer: Self-pay

## 2013-06-02 HISTORY — DX: Essential (primary) hypertension: I10

## 2013-06-02 HISTORY — DX: Hypothyroidism, unspecified: E03.9

## 2013-06-02 HISTORY — DX: Unspecified osteoarthritis, unspecified site: M19.90

## 2013-06-02 LAB — BASIC METABOLIC PANEL
BUN: 8 mg/dL (ref 6–23)
CO2: 33 meq/L — AB (ref 19–32)
Calcium: 8.8 mg/dL (ref 8.4–10.5)
Chloride: 99 mEq/L (ref 96–112)
Creatinine, Ser: 0.88 mg/dL (ref 0.50–1.10)
GFR calc Af Amer: 79 mL/min — ABNORMAL LOW (ref >90)
GFR calc non Af Amer: 68 mL/min — ABNORMAL LOW (ref >90)
GLUCOSE: 100 mg/dL — AB (ref 70–99)
POTASSIUM: 3.6 meq/L — AB (ref 3.7–5.3)
SODIUM: 140 meq/L (ref 137–147)

## 2013-06-02 LAB — CBC
HEMATOCRIT: 42.4 % (ref 36.0–46.0)
Hemoglobin: 14.1 g/dL (ref 12.0–15.0)
MCH: 29.7 pg (ref 26.0–34.0)
MCHC: 33.3 g/dL (ref 30.0–36.0)
MCV: 89.3 fL (ref 78.0–100.0)
Platelets: 181 10*3/uL (ref 150–400)
RBC: 4.75 MIL/uL (ref 3.87–5.11)
RDW: 15.6 % — ABNORMAL HIGH (ref 11.5–15.5)
WBC: 3.2 10*3/uL — ABNORMAL LOW (ref 4.0–10.5)

## 2013-06-03 ENCOUNTER — Encounter (HOSPITAL_COMMUNITY): Payer: Self-pay | Admitting: Pharmacist

## 2013-06-05 ENCOUNTER — Encounter (HOSPITAL_COMMUNITY): Payer: Self-pay | Admitting: Anesthesiology

## 2013-06-05 ENCOUNTER — Ambulatory Visit (HOSPITAL_COMMUNITY): Payer: BC Managed Care – PPO | Admitting: Anesthesiology

## 2013-06-05 ENCOUNTER — Encounter (HOSPITAL_COMMUNITY): Payer: BC Managed Care – PPO | Admitting: Anesthesiology

## 2013-06-05 ENCOUNTER — Ambulatory Visit (HOSPITAL_COMMUNITY)
Admission: RE | Admit: 2013-06-05 | Discharge: 2013-06-05 | Disposition: A | Discharge status: Home / Self Care | Payer: BC Managed Care – PPO | Source: Ambulatory Visit | Attending: Obstetrics and Gynecology | Admitting: Obstetrics and Gynecology

## 2013-06-05 ENCOUNTER — Encounter (HOSPITAL_COMMUNITY)
Admission: RE | Disposition: A | Discharge status: Home / Self Care | Payer: Self-pay | Source: Ambulatory Visit | Attending: Obstetrics and Gynecology

## 2013-06-05 DIAGNOSIS — Z853 Personal history of malignant neoplasm of breast: Secondary | ICD-10-CM | POA: Insufficient documentation

## 2013-06-05 DIAGNOSIS — E7800 Pure hypercholesterolemia, unspecified: Secondary | ICD-10-CM | POA: Insufficient documentation

## 2013-06-05 DIAGNOSIS — I10 Essential (primary) hypertension: Secondary | ICD-10-CM | POA: Insufficient documentation

## 2013-06-05 DIAGNOSIS — E039 Hypothyroidism, unspecified: Secondary | ICD-10-CM | POA: Insufficient documentation

## 2013-06-05 DIAGNOSIS — N840 Polyp of corpus uteri: Secondary | ICD-10-CM | POA: Insufficient documentation

## 2013-06-05 DIAGNOSIS — N950 Postmenopausal bleeding: Secondary | ICD-10-CM | POA: Insufficient documentation

## 2013-06-05 HISTORY — PX: DILATATION & CURRETTAGE/HYSTEROSCOPY WITH RESECTOCOPE: SHX5572

## 2013-06-05 SURGERY — DILATATION & CURETTAGE/HYSTEROSCOPY WITH RESECTOCOPE
Anesthesia: General

## 2013-06-05 MED ORDER — MIDAZOLAM HCL 2 MG/2ML IJ SOLN
INTRAMUSCULAR | Status: AC
  Filled 2013-06-05: qty 2

## 2013-06-05 MED ORDER — ONDANSETRON HCL 4 MG/2ML IJ SOLN
INTRAMUSCULAR | Status: DC | PRN
  Administered 2013-06-05: 4 mg via INTRAVENOUS

## 2013-06-05 MED ORDER — MIDAZOLAM HCL 2 MG/2ML IJ SOLN
INTRAMUSCULAR | Status: DC | PRN
  Administered 2013-06-05: 2 mg via INTRAVENOUS

## 2013-06-05 MED ORDER — KETOROLAC TROMETHAMINE 30 MG/ML IJ SOLN: 15.0000 mg | Freq: Once | INTRAMUSCULAR | Status: DC | PRN

## 2013-06-05 MED ORDER — LIDOCAINE HCL (CARDIAC) 20 MG/ML IV SOLN
INTRAVENOUS | Status: AC
  Filled 2013-06-05: qty 5

## 2013-06-05 MED ORDER — ONDANSETRON HCL 4 MG/2ML IJ SOLN: 4.0000 mg | Freq: Once | INTRAMUSCULAR | Status: DC | PRN

## 2013-06-05 MED ORDER — KETOROLAC TROMETHAMINE 30 MG/ML IJ SOLN
INTRAMUSCULAR | Status: AC
  Filled 2013-06-05: qty 1

## 2013-06-05 MED ORDER — LACTATED RINGERS IV SOLN
INTRAVENOUS | Status: DC
  Administered 2013-06-05 (×3): via INTRAVENOUS

## 2013-06-05 MED ORDER — ONDANSETRON HCL 4 MG/2ML IJ SOLN
INTRAMUSCULAR | Status: AC
  Filled 2013-06-05: qty 2

## 2013-06-05 MED ORDER — DEXAMETHASONE SODIUM PHOSPHATE 10 MG/ML IJ SOLN
INTRAMUSCULAR | Status: AC
  Filled 2013-06-05: qty 1

## 2013-06-05 MED ORDER — FENTANYL CITRATE 0.05 MG/ML IJ SOLN
INTRAMUSCULAR | Status: AC
  Filled 2013-06-05: qty 2

## 2013-06-05 MED ORDER — DEXAMETHASONE SODIUM PHOSPHATE 10 MG/ML IJ SOLN
INTRAMUSCULAR | Status: DC | PRN
  Administered 2013-06-05: 5 mg via INTRAVENOUS

## 2013-06-05 MED ORDER — KETOROLAC TROMETHAMINE 30 MG/ML IJ SOLN
INTRAMUSCULAR | Status: DC | PRN
  Administered 2013-06-05: 30 mg via INTRAVENOUS
  Administered 2013-06-05: 30 mg via INTRAMUSCULAR

## 2013-06-05 MED ORDER — FENTANYL CITRATE 0.05 MG/ML IJ SOLN
INTRAMUSCULAR | Status: AC
  Administered 2013-06-05: 50 ug via INTRAVENOUS
  Filled 2013-06-05: qty 2

## 2013-06-05 MED ORDER — PROPOFOL 10 MG/ML IV BOLUS
INTRAVENOUS | Status: DC | PRN
  Administered 2013-06-05: 200 mg via INTRAVENOUS

## 2013-06-05 MED ORDER — LIDOCAINE HCL (CARDIAC) 20 MG/ML IV SOLN
INTRAVENOUS | Status: DC | PRN
  Administered 2013-06-05: 30 mg via INTRAVENOUS

## 2013-06-05 MED ORDER — FENTANYL CITRATE 0.05 MG/ML IJ SOLN
25.0000 ug | INTRAMUSCULAR | Status: DC | PRN
  Administered 2013-06-05: 50 ug via INTRAVENOUS

## 2013-06-05 MED ORDER — MEPERIDINE HCL 25 MG/ML IJ SOLN: 6.2500 mg | INTRAMUSCULAR | Status: DC | PRN

## 2013-06-05 MED ORDER — IBUPROFEN 800 MG PO TABS: 800.0000 mg | ORAL_TABLET | Freq: Three times a day (TID) | ORAL | Status: DC | PRN

## 2013-06-05 MED ORDER — PROPOFOL 10 MG/ML IV EMUL
INTRAVENOUS | Status: AC
  Filled 2013-06-05: qty 20

## 2013-06-05 MED ORDER — FENTANYL CITRATE 0.05 MG/ML IJ SOLN
INTRAMUSCULAR | Status: DC | PRN
  Administered 2013-06-05 (×2): 25 ug via INTRAVENOUS
  Administered 2013-06-05: 50 ug via INTRAVENOUS

## 2013-06-05 SURGICAL SUPPLY — 23 items
CANISTER SUCT 3000ML (MISCELLANEOUS) ×3 IMPLANT
CATH ROBINSON RED A/P 16FR (CATHETERS) ×3 IMPLANT
CLOTH BEACON ORANGE TIMEOUT ST (SAFETY) ×3 IMPLANT
CONTAINER PREFILL 10% NBF 60ML (FORM) ×6 IMPLANT
DRAPE HYSTEROSCOPY (DRAPE) ×3 IMPLANT
DRSG TELFA 3X8 NADH (GAUZE/BANDAGES/DRESSINGS) ×3 IMPLANT
ELECT REM PT RETURN 9FT ADLT (ELECTROSURGICAL) ×3
ELECTRODE REM PT RTRN 9FT ADLT (ELECTROSURGICAL) ×1 IMPLANT
GLOVE BIOGEL PI IND STRL 7.0 (GLOVE) ×2 IMPLANT
GLOVE BIOGEL PI INDICATOR 7.0 (GLOVE) ×4
GLOVE ECLIPSE 6.5 STRL STRAW (GLOVE) ×3 IMPLANT
GOWN STRL REUS W/TWL LRG LVL3 (GOWN DISPOSABLE) ×6 IMPLANT
LOOP ANGLED CUTTING 22FR (CUTTING LOOP) IMPLANT
NDL SPNL 22GX3.5 QUINCKE BK (NEEDLE) ×1 IMPLANT
NEEDLE SPNL 22GX3.5 QUINCKE BK (NEEDLE) ×3 IMPLANT
PACK VAGINAL MINOR WOMEN LF (CUSTOM PROCEDURE TRAY) ×3 IMPLANT
PAD DRESSING TELFA 3X8 NADH (GAUZE/BANDAGES/DRESSINGS) ×1 IMPLANT
PAD OB MATERNITY 4.3X12.25 (PERSONAL CARE ITEMS) ×3 IMPLANT
SET TUBING HYSTEROSCOPY 2 NDL (TUBING) IMPLANT
SYR CONTROL 10ML LL (SYRINGE) ×3 IMPLANT
TOWEL OR 17X24 6PK STRL BLUE (TOWEL DISPOSABLE) ×6 IMPLANT
TUBE HYSTEROSCOPY W Y-CONNECT (TUBING) IMPLANT
WATER STERILE IRR 1000ML POUR (IV SOLUTION) ×3 IMPLANT

## 2013-06-05 NOTE — Brief Op Note (Signed)
06/05/2013  3:24 PM  PATIENT:  Rebecca Morgan  63 y.o. female  PRE-OPERATIVE DIAGNOSIS:  Postmenopausal Bleeding, Endometrial Mass  POST-OPERATIVE DIAGNOSIS:  Postmenopausal Bleeding, Endometrial polyps  PROCEDURE:  Diagnostic hysteroscopy, hysteroscopic resection of endometrial polyps, dilation and curettage  SURGEON:  Surgeon(s) and Role:    * Brycin Kille A Hieu Herms, MD - Primary  PHYSICIAN ASSISTANT:   ASSISTANTS: none   ANESTHESIA:   general Findings: multiple endometrial polyps, nl endocervical canal, tunnel shape cornual areas w/ polyps within and removed EBL:  Total I/O In: 1800 [I.V.:1800] Out: 210 [Urine:200; Blood:10]  BLOOD ADMINISTERED:none  DRAINS: none   LOCAL MEDICATIONS USED:  NONE  SPECIMEN:  Source of Specimen:  emc w/ polyps  DISPOSITION OF SPECIMEN:  PATHOLOGY  COUNTS:  YES  TOURNIQUET:  * No tourniquets in log *  DICTATION: .Other Dictation: Dictation Number [id number]  PLAN OF CARE: Discharge to home after PACU  PATIENT DISPOSITION:  PACU - hemodynamically stable.   Delay start of Pharmacological VTE agent (>24hrs) due to surgical blood loss or risk of bleeding: no

## 2013-06-05 NOTE — Anesthesia Preprocedure Evaluation (Signed)
Anesthesia Evaluation  Patient identified by MRN, date of birth, ID band Patient awake    Reviewed: Allergy & Precautions, H&P , NPO status , Patient's Chart, lab work & pertinent test results  Airway Mallampati: II TM Distance: >3 FB Neck ROM: full    Dental no notable dental hx. (+) Teeth Intact   Pulmonary neg pulmonary ROS,    Pulmonary exam normal       Cardiovascular hypertension, Pt. on medications and Pt. on home beta blockers negative cardio ROS      Neuro/Psych negative neurological ROS  negative psych ROS   GI/Hepatic negative GI ROS, Neg liver ROS,   Endo/Other  Hypothyroidism Morbid obesity  Renal/GU negative Renal ROS     Musculoskeletal   Abdominal Normal abdominal exam  (+)   Peds  Hematology negative hematology ROS (+)   Anesthesia Other Findings   Reproductive/Obstetrics negative OB ROS                           Anesthesia Physical Anesthesia Plan  ASA: III  Anesthesia Plan: General   Post-op Pain Management:    Induction: Intravenous  Airway Management Planned: LMA  Additional Equipment:   Intra-op Plan:   Post-operative Plan:   Informed Consent: I have reviewed the patients History and Physical, chart, labs and discussed the procedure including the risks, benefits and alternatives for the proposed anesthesia with the patient or authorized representative who has indicated his/her understanding and acceptance.     Plan Discussed with: CRNA and Surgeon  Anesthesia Plan Comments:         Anesthesia Quick Evaluation

## 2013-06-05 NOTE — H&P (Signed)
Rebecca Morgan is an 63 y.o. female. [id number] BF PMP not on HRT presents for dx hysteroscopy, hysteroscopic resection of endometrial mass , D&C due to workup of PMB with sonohysterogram showing 1.2 cm polypoid mass. Pt had endometrial polyp removed in 2013.   Pertinent Gynecological History: Menses: post-menopausal Bleeding: post menopausal bleeding Contraception: none DES exposure: denies Blood transfusions: none Sexually transmitted diseases:  Previous GYN Procedures: Dx hsyteroscopy, hysteroscopic resection of polyp  Last mammogram: normal Date: 2014 Last pap: normal Date: 2014 OB History: G3, P2  Menstrual History: Menarche age: n/a No LMP recorded. Patient is postmenopausal.    Past Medical History  Diagnosis Date  . High cholesterol   . Heart disease   . Cancer of breast   . SVD (spontaneous vaginal delivery)     x 2  . Hypertension   . Hypothyroidism   . Arthritis     knees - otc med prn    Past Surgical History  Procedure Laterality Date  . Meniscus repair Right 2005  . Breast lumpectomy  2001    cancer  . Colonoscopy      Family History  Problem Relation Age of Onset  . Cancer Mother   . Stroke Father     Social History:  reports that she has never smoked. She has never used smokeless tobacco. She reports that she does not drink alcohol or use illicit drugs.  Allergies: No Known Allergies  No prescriptions prior to admission    Review of Systems  All other systems reviewed and are negative.    There were no vitals taken for this visit. Physical Exam  Constitutional: She is oriented to person, place, and time. She appears well-developed and well-nourished.  HENT:  Head: Normocephalic.  Eyes: EOM are normal.  Neck: Neck supple.  Cardiovascular: Regular rhythm.   Respiratory: Breath sounds normal.  GI: Soft.  Genitourinary: Vagina normal.  Musculoskeletal: She exhibits no edema.  Neurological: She is alert and oriented to person, place, and  time.  Skin: Skin is warm and dry.   Vulva nl Vagina atrophic changes Cervix no lesion Uterus sl enlarged No results found for this or any previous visit (from the past 24 hour(s)).  No results found.  Assessment/Plan: PMB Hx right breast cancer Endometrial mass  P) dx hysteroscopy, hysteroscopic resection of endometrial mass, D&C. Procedure reviewed. Risk of surgery reviewed including, infection, bleeding, thermal injury, uterine perforation and its risk, fluid overload.  Sokha Craker A 06/05/2013, 5:43 AM

## 2013-06-05 NOTE — Anesthesia Postprocedure Evaluation (Signed)
  Anesthesia Post-op Note  Patient: Rebecca Morgan  Procedure(s) Performed: Procedure(s) with comments: DILATATION & CURETTAGE/HYSTEROSCOPY WITH RESECTOCOPE (N/A) - 1 hr.  Patient Location: PACU  Anesthesia Type:General  Level of Consciousness: awake, alert  and oriented  Airway and Oxygen Therapy: Patient Spontanous Breathing  Post-op Pain: none  Post-op Assessment: Post-op Vital signs reviewed, Patient's Cardiovascular Status Stable, Respiratory Function Stable, Patent Airway, No signs of Nausea or vomiting and Pain level controlled  Post-op Vital Signs: Reviewed and stable  Complications: No apparent anesthesia complications

## 2013-06-05 NOTE — Transfer of Care (Signed)
Immediate Anesthesia Transfer of Care Note  Patient: Rebecca Morgan  Procedure(s) Performed: Procedure(s) with comments: DILATATION & CURETTAGE/HYSTEROSCOPY WITH RESECTOCOPE (N/A) - 1 hr.  Patient Location: PACU  Anesthesia Type:General  Level of Consciousness: awake, alert  and oriented  Airway & Oxygen Therapy: Patient Spontanous Breathing and Patient connected to nasal cannula oxygen  Post-op Assessment: Report given to PACU RN and Post -op Vital signs reviewed and stable  Post vital signs: Reviewed and stable  Complications: No apparent anesthesia complications

## 2013-06-05 NOTE — Discharge Instructions (Signed)
CALL  IF TEMP>100.4, NOTHING PER VAGINA X 1 WK, CALL IF SOAKING A MAXI  PAD EVERY HOUR OR MORE FREQUENTLY  No Ibuprofen containing products (ie Advil, Aleve, Motrin, etc.) until after 9 pm tonight.]  DISCHARGE INSTRUCTIONS: HYSTEROSCOPY / ENDOMETRIAL ABLATION The following instructions have been prepared to help you care for yourself upon your return home.  Personal hygiene:  Use sanitary pads for vaginal drainage, not tampons.  Shower the day after your procedure.  NO tub baths, pools or Jacuzzis for 2-3 weeks.  Wipe front to back after using the bathroom.  Activity and limitations:  Do NOT drive or operate any equipment for 24 hours. The effects of anesthesia are still present and drowsiness may result.  Do NOT rest in bed all day.  Walking is encouraged.  Walk up and down stairs slowly.  You may resume your normal activity in one to two days or as indicated by your physician. Sexual activity: NO intercourse for at least 2 weeks after the procedure, or as indicated by your Doctor.  Diet: Eat a light meal as desired this evening. You may resume your usual diet tomorrow.  Return to Work: You may resume your work activities in one to two days or as indicated by Marine scientist.  What to expect after your surgery: Expect to have vaginal bleeding/discharge for 2-3 days and spotting for up to 10 days. It is not unusual to have soreness for up to 1-2 weeks. You may have a slight burning sensation when you urinate for the first day. Mild cramps may continue for a couple of days. You may have a regular period in 2-6 weeks.  Call your doctor for any of the following:  Excessive vaginal bleeding or clotting, saturating and changing one pad every hour.  Inability to urinate 6 hours after discharge from hospital.  Pain not relieved by pain medication.  Fever of 100.4 F or greater.  Unusual vaginal discharge or odor.  Return to office _________________Call for an appointment  ___________________ Patients signature: ______________________ Nurses signature ________________________  [facility] [phone]

## 2013-06-06 NOTE — Op Note (Signed)
NAME:  Rebecca Morgan, CHAY NO.:  [id number]  MEDICAL RECORD NO.:  [id number]  LOCATION:  [facility]                          FACILITY[facility]  PHYSICIAN:  Servando Salina, M.D.DATE OF BIRTH:  Jul 02, 1950  DATE OF PROCEDURE:  06/05/2013 DATE OF DISCHARGE:  06/05/2013                              OPERATIVE REPORT   PREOPERATIVE DIAGNOSES:  Postmenopausal bleeding, endometrial mass.  POSTOPERATIVE DIAGNOSES:  Postmenopausal bleeding, endometrial polyps.  PROCEDURES:  Diagnostic hysteroscopy, hysteroscopic resection of multiple endometrial polyps, and dilation and curettage.  ANESTHESIA:  General.  SURGEON:  Servando Salina, M.D.  ASSISTANT:  None.  DESCRIPTION OF PROCEDURE:  Under adequate general anesthesia, the patient was placed in the dorsal lithotomy position.  She was sterilely prepped and draped in usual fashion.  The bladder was catheterized for moderate amount of urine.  Examination under anesthesia revealed an anteflexed uterus.  No adnexal masses could be appreciated, but limited by the patient's body habitus.  A bivalved speculum was placed in vagina.  Single-tooth tenaculum was placed on the anterior lip of the cervix.  The cervix was serially dilated to #[facility] dilator.  A glycine-primed resectoscope was introduced into the uterine cavity without incident.  The cavity was irregular in shape with more of an arcuate appearance in the fundal region.  In the patient's right corner region, there was a polypoid lesion.  There was also a smaller one in that tunnel on the left side and an additional one in the posterior lower uterine segment.  Using the single loop, all the polypoid lesions were resected and removed.  The cavity was then gently curetted for scant amount of tissue.  SPECIMEN:  Endometrial curetting with polyps sent to Pathology.  ESTIMATED BLOOD LOSS:  10 mL.  INTRAOPERATIVE FLUID:  1800 mL.  URINE OUTPUT:  200 mL clear, yellow  urine.  Sponge and instrument counts x2 were correct.  COMPLICATION:  None.  The patient tolerated procedure well and was transferred to recovery in stable condition.     Servando Salina, M.D.     [place]/MEDQ  D:  06/05/2013  T:  06/06/2013  Job:  [id number]

## 2013-06-08 ENCOUNTER — Encounter (HOSPITAL_COMMUNITY): Payer: Self-pay | Admitting: Obstetrics and Gynecology

## 2013-10-27 ENCOUNTER — Other Ambulatory Visit: Payer: Self-pay

## 2013-10-27 DIAGNOSIS — Z1231 Encounter for screening mammogram for malignant neoplasm of breast: Secondary | ICD-10-CM

## 2013-11-05 ENCOUNTER — Ambulatory Visit
Admission: RE | Admit: 2013-11-05 | Discharge: 2013-11-05 | Disposition: A | Discharge status: Home / Self Care | Payer: BC Managed Care – PPO | Source: Ambulatory Visit

## 2013-11-05 DIAGNOSIS — Z1231 Encounter for screening mammogram for malignant neoplasm of breast: Secondary | ICD-10-CM

## 2015-07-22 ENCOUNTER — Other Ambulatory Visit: Payer: Self-pay

## 2015-07-22 DIAGNOSIS — Z1231 Encounter for screening mammogram for malignant neoplasm of breast: Secondary | ICD-10-CM

## 2015-08-01 ENCOUNTER — Ambulatory Visit
Admission: RE | Admit: 2015-08-01 | Discharge: 2015-08-01 | Disposition: A | Discharge status: Home / Self Care | Payer: 59 | Source: Ambulatory Visit

## 2015-08-01 DIAGNOSIS — Z1231 Encounter for screening mammogram for malignant neoplasm of breast: Secondary | ICD-10-CM

## 2016-07-20 ENCOUNTER — Other Ambulatory Visit: Payer: Self-pay | Admitting: Obstetrics and Gynecology

## 2016-07-20 DIAGNOSIS — Z1231 Encounter for screening mammogram for malignant neoplasm of breast: Secondary | ICD-10-CM

## 2016-09-03 ENCOUNTER — Ambulatory Visit: Payer: 59

## 2016-09-14 ENCOUNTER — Ambulatory Visit
Admission: RE | Admit: 2016-09-14 | Discharge: 2016-09-14 | Disposition: A | Discharge status: Home / Self Care | Payer: 59 | Source: Ambulatory Visit | Attending: Obstetrics and Gynecology | Admitting: Obstetrics and Gynecology

## 2016-09-14 DIAGNOSIS — Z1231 Encounter for screening mammogram for malignant neoplasm of breast: Secondary | ICD-10-CM

## 2017-08-27 ENCOUNTER — Other Ambulatory Visit: Payer: Self-pay | Admitting: Obstetrics and Gynecology

## 2017-08-27 DIAGNOSIS — Z1231 Encounter for screening mammogram for malignant neoplasm of breast: Secondary | ICD-10-CM

## 2017-10-07 ENCOUNTER — Ambulatory Visit
Admission: RE | Admit: 2017-10-07 | Discharge: 2017-10-07 | Disposition: A | Discharge status: Home / Self Care | Payer: 59 | Source: Ambulatory Visit | Attending: Obstetrics and Gynecology | Admitting: Obstetrics and Gynecology

## 2017-10-07 DIAGNOSIS — Z1231 Encounter for screening mammogram for malignant neoplasm of breast: Secondary | ICD-10-CM

## 2017-11-14 ENCOUNTER — Ambulatory Visit
Admission: RE | Admit: 2017-11-14 | Discharge: 2017-11-14 | Disposition: A | Discharge status: Home / Self Care | Payer: 59 | Source: Ambulatory Visit | Attending: Cardiovascular Disease | Admitting: Cardiovascular Disease

## 2017-11-14 ENCOUNTER — Other Ambulatory Visit: Payer: Self-pay | Admitting: Cardiovascular Disease

## 2017-11-14 DIAGNOSIS — R062 Wheezing: Secondary | ICD-10-CM

## 2018-02-13 ENCOUNTER — Other Ambulatory Visit: Payer: Self-pay | Admitting: Obstetrics and Gynecology

## 2018-02-13 DIAGNOSIS — E2839 Other primary ovarian failure: Secondary | ICD-10-CM

## 2018-03-18 ENCOUNTER — Ambulatory Visit
Admission: RE | Admit: 2018-03-18 | Discharge: 2018-03-18 | Disposition: A | Discharge status: Home / Self Care | Payer: PRIVATE HEALTH INSURANCE | Source: Ambulatory Visit | Attending: Obstetrics and Gynecology | Admitting: Obstetrics and Gynecology

## 2018-03-18 DIAGNOSIS — E2839 Other primary ovarian failure: Secondary | ICD-10-CM

## 2018-08-29 ENCOUNTER — Other Ambulatory Visit: Payer: Self-pay | Admitting: Obstetrics and Gynecology

## 2018-08-29 DIAGNOSIS — Z1231 Encounter for screening mammogram for malignant neoplasm of breast: Secondary | ICD-10-CM

## 2018-10-16 ENCOUNTER — Other Ambulatory Visit: Payer: Self-pay

## 2018-10-16 ENCOUNTER — Ambulatory Visit
Admission: RE | Admit: 2018-10-16 | Discharge: 2018-10-16 | Disposition: A | Discharge status: Home / Self Care | Payer: BC Managed Care – PPO | Source: Ambulatory Visit | Attending: Obstetrics and Gynecology | Admitting: Obstetrics and Gynecology

## 2018-10-16 DIAGNOSIS — Z1231 Encounter for screening mammogram for malignant neoplasm of breast: Secondary | ICD-10-CM

## 2019-09-07 ENCOUNTER — Other Ambulatory Visit: Payer: Self-pay | Admitting: Obstetrics and Gynecology

## 2019-09-07 DIAGNOSIS — Z1231 Encounter for screening mammogram for malignant neoplasm of breast: Secondary | ICD-10-CM

## 2019-09-11 ENCOUNTER — Encounter (HOSPITAL_COMMUNITY): Payer: Self-pay

## 2019-09-11 NOTE — Progress Notes (Signed)
Left message for Caryl Pina at Dr. Harrietta Guardian office to request orders in epic.

## 2019-09-11 NOTE — Progress Notes (Addendum)
COVID Vaccine Completed: will update at pre op Date COVID Vaccine completed:will update at pre op COVID vaccine manufacturer: Pfizer    Moderna   [employer]   PCP - Dr. Jeanne Ivan Cardiologist - will update at pre op  Chest x-ray - greater than 1 year EKG - EKG at pre op 09/15/2019 in epic Stress Test - will update at pre op ECHO - will update at pre op Cardiac Cath - will update at pre op  Sleep Study - 01/30/2013 in epic CPAP -   Fasting Blood Sugar - will update at pre op Checks Blood Sugar _will update at pre op____ times a day  Blood Thinner Instructions:will update at pre op Aspirin Instructions:will update at pre op Last Dose:will update at pre op  Anesthesia review: CAD, HTN  Patient denies shortness of breath, fever, cough and chest pain at PAT appointment   Patient verbalized understanding of instructions that were given to them at the PAT appointment. Patient was also instructed that they will need to review over the PAT instructions again at home before surgery.

## 2019-09-11 NOTE — Patient Instructions (Addendum)
DUE TO COVID-19 ONLY ONE VISITOR ARE ALLOWED TO COME WITH YOU AND STAY IN THE WAITING ROOM ONLY DURING PRE OP AND PROCEDURE. THEN TWO VISITORS MAY VISIT WITH YOU IN YOUR PRIVATE ROOM DURING VISITING HOURS ONLY!!   COVID SWAB TESTING MUST BE COMPLETED ON:  Tuesday, September 22, 2019 @ 8:40 am.   [address], [place] -[facility] enter pre surgical testing line (Must self quarantine after testing. Follow instructions on handout.)             Your procedure is scheduled on: Friday, September 25, 2019   Report to [facility] Main  Entrance   Report to Short Stay at 5:30 AM   [facility])   Call this number if you have problems the morning of surgery [phone]   Do not eat food or drink liquids :After Midnight.   May have clear liquids until: 4:30 am day of surgery     Oral Hygiene is also important to reduce your risk of infection.                                    Remember - BRUSH YOUR TEETH THE MORNING OF SURGERY WITH YOUR REGULAR TOOTHPASTE   Do NOT smoke after Midnight   Take these medicines the morning of surgery with A SIP OF WATER: Levothyroxine, Metoprolol                               You may not have any metal on your body including hair pins, jewelry, and body piercings             Do not wear nail polish,make up, lotions, powders, perfumes/cologne, or deodorant.                          Do not bring valuables to the hospital. [place].   Contacts, dentures or bridgework may not be worn into surgery.   Bring small overnight bag day of surgery.   Special Instructions: Bring a copy of your healthcare power of attorney and living will documents         the day of surgery if you haven't scanned them in before.              Please read over the following fact sheets you were given: IF YOU HAVE QUESTIONS ABOUT YOUR PRE OP INSTRUCTIONS PLEASE CALL [phone]   [place] - Preparing for  Surgery Before surgery, you can play an important role.  Because skin is not sterile, your skin needs to be as free of germs as possible.  You can reduce the number of germs on your skin by washing with CHG (chlorahexidine gluconate) soap before surgery.  CHG is an antiseptic cleaner which kills germs and bonds with the skin to continue killing germs even after washing. Please DO NOT use if you have an allergy to CHG or antibacterial soaps.  If your skin becomes reddened/irritated stop using the CHG and inform your nurse when you arrive at Short Stay. Do not shave (including legs and underarms) for at least 48 hours prior to the first CHG shower.  You may shave your face/neck.  Please follow these instructions  carefully:  1.  Shower with CHG Soap the night before surgery and the  morning of surgery.  2.  If you choose to wash your hair, wash your hair first as usual with your normal  shampoo.  3.  After you shampoo, rinse your hair and body thoroughly to remove the shampoo.                             4.  Use CHG as you would any other liquid soap.  You can apply chg directly to the skin and wash.  Gently with a scrungie or clean washcloth.  5.  Apply the CHG Soap to your body ONLY FROM THE NECK DOWN.   Do   not use on face/ open                           Wound or open sores. Avoid contact with eyes, ears mouth and   genitals (private parts).                       Wash face,  Genitals (private parts) with your normal soap.             6.  Wash thoroughly, paying special attention to the area where your    surgery  will be performed.  7.  Thoroughly rinse your body with warm water from the neck down.  8.  DO NOT shower/wash with your normal soap after using and rinsing off the CHG Soap.                9.  Pat yourself dry with a clean towel.            10.  Wear clean pajamas.            11.  Place clean sheets on your bed the night of your first shower and do not  sleep with pets. Day of Surgery  : Do not apply any lotions/deodorants the morning of surgery.  Please wear clean clothes to the hospital/surgery center.  FAILURE TO FOLLOW THESE INSTRUCTIONS MAY RESULT IN THE CANCELLATION OF YOUR SURGERY  PATIENT SIGNATURE_________________________________  NURSE SIGNATURE__________________________________  ________________________________________________________________________

## 2019-09-11 NOTE — H&P (View-Only) (Signed)
COVID Vaccine Completed: will update at pre op Date COVID Vaccine completed:will update at pre op COVID vaccine manufacturer: Pfizer    Moderna   [employer]   PCP - Dr. Jeanne Ivan Cardiologist - will update at pre op  Chest x-ray - greater than 1 year EKG - EKG at pre op 09/15/2019 in epic Stress Test - will update at pre op ECHO - will update at pre op Cardiac Cath - will update at pre op  Sleep Study - 01/30/2013 in epic CPAP -   Fasting Blood Sugar - will update at pre op Checks Blood Sugar _will update at pre op____ times a day  Blood Thinner Instructions:will update at pre op Aspirin Instructions:will update at pre op Last Dose:will update at pre op  Anesthesia review: CAD, HTN  Patient denies shortness of breath, fever, cough and chest pain at PAT appointment   Patient verbalized understanding of instructions that were given to them at the PAT appointment. Patient was also instructed that they will need to review over the PAT instructions again at home before surgery.

## 2019-09-15 ENCOUNTER — Other Ambulatory Visit: Payer: Self-pay

## 2019-09-15 ENCOUNTER — Encounter (HOSPITAL_COMMUNITY): Payer: Self-pay

## 2019-09-15 ENCOUNTER — Encounter (HOSPITAL_COMMUNITY)
Admission: RE | Admit: 2019-09-15 | Discharge: 2019-09-15 | Disposition: A | Discharge status: Home / Self Care | Payer: Medicare HMO | Source: Ambulatory Visit | Attending: Specialist | Admitting: Specialist

## 2019-09-15 DIAGNOSIS — Z01818 Encounter for other preprocedural examination: Secondary | ICD-10-CM | POA: Insufficient documentation

## 2019-09-15 HISTORY — DX: Unspecified osteoarthritis, unspecified site: M19.90

## 2019-09-15 LAB — BASIC METABOLIC PANEL
Anion gap: 7 (ref 5–15)
BUN: 17 mg/dL (ref 8–23)
CO2: 31 mmol/L (ref 22–32)
Calcium: 9.2 mg/dL (ref 8.9–10.3)
Chloride: 102 mmol/L (ref 98–111)
Creatinine, Ser: 0.85 mg/dL (ref 0.44–1.00)
GFR calc Af Amer: >60 mL/min (ref >60)
GFR calc non Af Amer: >60 mL/min (ref >60)
Glucose, Bld: 101 mg/dL — ABNORMAL HIGH (ref 70–99)
Potassium: 4 mmol/L (ref 3.5–5.1)
Sodium: 140 mmol/L (ref 135–145)

## 2019-09-15 LAB — CBC
HCT: 44 % (ref 36.0–46.0)
Hemoglobin: 14.2 g/dL (ref 12.0–15.0)
MCH: 30.3 pg (ref 26.0–34.0)
MCHC: 32.3 g/dL (ref 30.0–36.0)
MCV: 93.8 fL (ref 80.0–100.0)
Platelets: 227 10*3/uL (ref 150–400)
RBC: 4.69 MIL/uL (ref 3.87–5.11)
RDW: 15.8 % — ABNORMAL HIGH (ref 11.5–15.5)
WBC: 6 10*3/uL (ref 4.0–10.5)
nRBC: 0 % (ref 0.0–0.2)

## 2019-09-15 LAB — SURGICAL PCR SCREEN
MRSA, PCR: NEGATIVE
Staphylococcus aureus: NEGATIVE

## 2019-09-16 ENCOUNTER — Ambulatory Visit: Payer: BC Managed Care – PPO

## 2019-09-16 NOTE — H&P (Signed)
TOTAL KNEE ADMISSION H&P  Patient is being admitted for right total knee arthroplasty.  Subjective:  Chief Complaint:right knee pain.  HPI: Rebecca Morgan, 69 y.o. female, has a history of pain and functional disability in the right knee due to arthritis and has failed non-surgical conservative treatments for greater than 12 weeks to includeNSAID's and/or analgesics, corticosteriod injections, viscosupplementation injections, flexibility and strengthening excercises and weight reduction as appropriate.  Onset of symptoms was gradual, starting 3 years ago with gradually worsening course since that time. The patient noted no past surgery on the right knee(s).  Patient currently rates pain in the right knee(s) at 6 out of 10 with activity. Patient has night pain, worsening of pain with activity and weight bearing, pain that interferes with activities of daily living, pain with passive range of motion, crepitus and joint swelling.  Patient has evidence of periarticular osteophytes and joint space narrowing by imaging studies. This patient has had no previous injury. There is no active infection.  Patient Active Problem List   Diagnosis Date Noted  . Sleep disorder 10/07/2012   Past Medical History:  Diagnosis Date  . Cancer of breast ([place])    breast  . Heart disease   . High cholesterol   . Hypertension   . Hypothyroidism   . OA (osteoarthritis)    knees - otc med prn  . SVD (spontaneous vaginal delivery)    x 2    Past Surgical History:  Procedure Laterality Date  . BREAST LUMPECTOMY Right 2001   cancer  . COLONOSCOPY    . DILATATION & CURRETTAGE/HYSTEROSCOPY WITH RESECTOCOPE N/A 06/05/2013   Procedure: DILATATION & CURETTAGE/HYSTEROSCOPY WITH RESECTOCOPE;  Surgeon: Marvene Staff, MD;  Location: [facility];  Service: Gynecology;  Laterality: N/A;  1 hr.  . MENISCUS REPAIR Right 2005    No current facility-administered medications for this encounter.   Current Outpatient  Medications  Medication Sig Dispense Refill Last Dose  . Calcium-Magnesium-Vitamin D (CALCIUM 1200+D3 PO) Take 3 tablets by mouth daily.     . hydrochlorothiazide (HYDRODIURIL) 12.5 MG tablet Take 12.5 mg by mouth every Monday, Wednesday, and Friday.      . levothyroxine (SYNTHROID) 112 MCG tablet Take 112 mcg by mouth daily before breakfast.      . metoprolol succinate (TOPROL-XL) 25 MG 24 hr tablet Take 25 mg by mouth daily.      . Multiple Vitamin (MULTIVITAMIN WITH MINERALS) TABS tablet Take 1 tablet by mouth daily.     . potassium chloride (MICRO-K) 10 MEQ CR capsule Take 10 mEq by mouth daily.     Marland Kitchen ibuprofen (ADVIL,MOTRIN) 800 MG tablet Take 1 tablet (800 mg total) by mouth every 8 (eight) hours as needed. (Patient not taking: Reported on 09/01/2019) 30 tablet 0 Not Taking at Unknown time   No Known Allergies  Social History   Tobacco Use  . Smoking status: Never Smoker  . Smokeless tobacco: Never Used  Substance Use Topics  . Alcohol use: No    Family History  Problem Relation Age of Onset  . Cancer Mother   . Stroke Father   . Breast cancer Neg Hx      Review of Systems  Constitutional: Negative.   HENT: Positive for dental problem (partial dentures).   Eyes: Negative.   Respiratory: Negative.   Cardiovascular: Negative.   Gastrointestinal: Negative.   Endocrine: Negative.   Genitourinary: Negative.   Musculoskeletal: Positive for arthralgias, joint swelling and myalgias.  Skin: Negative.  Allergic/Immunologic: Negative.   Neurological: Negative.   Hematological: Negative.   Psychiatric/Behavioral: Negative.     Objective:  Physical Exam Vitals reviewed.  Constitutional:      General: She is not in acute distress.    Appearance: Normal appearance.  HENT:     Head: Normocephalic and atraumatic.  Neck:     Vascular: No carotid bruit.  Cardiovascular:     Rate and Rhythm: Normal rate and regular rhythm.     Pulses: Normal pulses.     Heart sounds: Normal  heart sounds.  Pulmonary:     Effort: Pulmonary effort is normal.     Breath sounds: Normal breath sounds.  Abdominal:     General: There is no distension.     Palpations: Abdomen is soft. There is no mass.     Tenderness: There is no abdominal tenderness. There is no guarding.  Musculoskeletal:     Cervical back: Normal range of motion and neck supple. No tenderness.     Comments: Tenderness over medial and lateral joint line Decreased ROM Crepitus with extension and flexion Calf supple  Skin:    General: Skin is warm and dry.     Capillary Refill: Capillary refill takes less than 2 seconds.     Coloration: Skin is not jaundiced or pale.     Findings: No bruising, erythema, lesion or rash.  Neurological:     General: No focal deficit present.     Mental Status: She is alert and oriented to person, place, and time.  Psychiatric:        Mood and Affect: Mood normal.        Behavior: Behavior normal.        Thought Content: Thought content normal.        Judgment: Judgment normal.     Vital signs in last 24 hours: Temp:  [98.9 F (37.2 C)] 98.9 F (37.2 C) (07/06 1418) Pulse Rate:  [86] 86 (07/06 1418) Resp:  [16] 16 (07/06 1418) BP: (121)/(58) 121/58 (07/06 1418) SpO2:  [98 %] 98 % (07/06 1418) Weight:  [98 kg] 98 kg (07/06 1420)  Labs:   Estimated body mass index is 35.94 kg/m as calculated from the following:   Height as of 09/15/19: 5\' 5"  (1.651 m).   Weight as of 09/15/19: 98 kg.   Imaging Review Plain radiographs demonstrate severe degenerative joint disease of the right knee(s). The overall alignment isneutral. The bone quality appears to be fair for age and reported activity level.      Assessment/Plan:  End stage arthritis, right knee   The patient history, physical examination, clinical judgment of the provider and imaging studies are consistent with end stage degenerative joint disease of the right knee(s) and total knee arthroplasty is deemed  medically necessary. The treatment options including medical management, injection therapy arthroscopy and arthroplasty were discussed at length. The risks and benefits of total knee arthroplasty were presented and reviewed. The risks due to aseptic loosening, infection, stiffness, patella tracking problems, thromboembolic complications and other imponderables were discussed. The patient acknowledged the explanation, agreed to proceed with the plan and consent was signed. Patient is being admitted for inpatient treatment for surgery, pain control, PT, OT, prophylactic antibiotics, VTE prophylaxis, progressive ambulation and ADL's and discharge planning. The patient is planning to be discharged home and will be doing out patient PT at our office.      Patient's anticipated LOS is less than 2 midnights, meeting these requirements: - Younger than  8 - Lives within 1 hour of care - Has a competent adult at home to recover with post-op recover - NO history of  - Chronic pain requiring opiods  - Diabetes  - Coronary Artery Disease  - Heart failure  - Heart attack  - Stroke  - DVT/VTE  - Cardiac arrhythmia  - Respiratory Failure/COPD  - Renal failure  - Anemia  - Advanced Liver disease

## 2019-09-22 ENCOUNTER — Other Ambulatory Visit (HOSPITAL_COMMUNITY)
Admission: RE | Admit: 2019-09-22 | Discharge: 2019-09-22 | Disposition: A | Discharge status: Home / Self Care | Payer: Medicare HMO | Source: Ambulatory Visit | Attending: Specialist | Admitting: Specialist

## 2019-09-22 DIAGNOSIS — Z20822 Contact with and (suspected) exposure to covid-19: Secondary | ICD-10-CM | POA: Insufficient documentation

## 2019-09-22 LAB — SARS CORONAVIRUS 2 (TAT 6-24 HRS): SARS Coronavirus 2: NEGATIVE

## 2019-09-24 MED ORDER — BUPIVACAINE LIPOSOME 1.3 % IJ SUSP
20.0000 mL | Freq: Once | INTRAMUSCULAR | Status: AC
  Filled 2019-09-24: qty 20

## 2019-09-24 NOTE — Anesthesia Preprocedure Evaluation (Addendum)
Anesthesia Evaluation  Patient identified by MRN, date of birth, ID band Patient awake    Reviewed: Allergy & Precautions, NPO status , Patient's Chart, lab work & pertinent test results  History of Anesthesia Complications Negative for: history of anesthetic complications  Airway Mallampati: II  TM Distance: >3 FB Neck ROM: Full    Dental  (+) Dental Advisory Given, Teeth Intact   Pulmonary neg pulmonary ROS,    Pulmonary exam normal        Cardiovascular hypertension, Pt. on medications and Pt. on home beta blockers Normal cardiovascular exam     Neuro/Psych negative neurological ROS  negative psych ROS   GI/Hepatic negative GI ROS, Neg liver ROS,   Endo/Other  Hypothyroidism  Obesity   Renal/GU negative Renal ROS     Musculoskeletal  (+) Arthritis , Osteoarthritis,    Abdominal   Peds  Hematology negative hematology ROS (+)  Plt 227k    Anesthesia Other Findings Covid test negative   Reproductive/Obstetrics                            Anesthesia Physical Anesthesia Plan  ASA: II  Anesthesia Plan: Spinal   Post-op Pain Management:  Regional for Post-op pain   Induction:   PONV Risk Score and Plan: 2 and Treatment may vary due to age or medical condition and Propofol infusion  Airway Management Planned: Natural Airway and Simple Face Mask  Additional Equipment: None  Intra-op Plan:   Post-operative Plan:   Informed Consent: I have reviewed the patients History and Physical, chart, labs and discussed the procedure including the risks, benefits and alternatives for the proposed anesthesia with the patient or authorized representative who has indicated his/her understanding and acceptance.       Plan Discussed with: CRNA and Anesthesiologist  Anesthesia Plan Comments: (Labs reviewed, platelets acceptable. Discussed risks and benefits of spinal, including  spinal/epidural hematoma, infection, failed block, and PDPH. Patient expressed understanding and wished to proceed. )       Anesthesia Quick Evaluation

## 2019-09-25 ENCOUNTER — Observation Stay (HOSPITAL_COMMUNITY)
Admission: RE | Admit: 2019-09-25 | Discharge: 2019-09-27 | Disposition: A | Discharge status: Home / Self Care | Payer: Medicare HMO | Attending: Specialist | Admitting: Specialist

## 2019-09-25 ENCOUNTER — Ambulatory Visit (HOSPITAL_COMMUNITY): Payer: Medicare HMO | Admitting: Certified Registered"

## 2019-09-25 ENCOUNTER — Encounter (HOSPITAL_COMMUNITY)
Admission: RE | Disposition: A | Discharge status: Home / Self Care | Payer: Self-pay | Source: Home / Self Care | Attending: Specialist

## 2019-09-25 ENCOUNTER — Encounter (HOSPITAL_COMMUNITY): Payer: Self-pay | Admitting: Specialist

## 2019-09-25 ENCOUNTER — Other Ambulatory Visit: Payer: Self-pay

## 2019-09-25 DIAGNOSIS — I10 Essential (primary) hypertension: Secondary | ICD-10-CM | POA: Diagnosis not present

## 2019-09-25 DIAGNOSIS — Z79899 Other long term (current) drug therapy: Secondary | ICD-10-CM | POA: Diagnosis not present

## 2019-09-25 DIAGNOSIS — M1711 Unilateral primary osteoarthritis, right knee: Principal | ICD-10-CM | POA: Diagnosis present

## 2019-09-25 DIAGNOSIS — E039 Hypothyroidism, unspecified: Secondary | ICD-10-CM | POA: Diagnosis not present

## 2019-09-25 HISTORY — PX: TOTAL KNEE ARTHROPLASTY: SHX125

## 2019-09-25 LAB — PROTIME-INR
INR: 1.1 (ref 0.8–1.2)
Prothrombin Time: 13.5 seconds (ref 11.4–15.2)

## 2019-09-25 LAB — APTT: aPTT: 37 seconds — ABNORMAL HIGH (ref 24–36)

## 2019-09-25 LAB — COMPREHENSIVE METABOLIC PANEL
ALT: 14 U/L (ref 0–44)
AST: 16 U/L (ref 15–41)
Albumin: 3.6 g/dL (ref 3.5–5.0)
Alkaline Phosphatase: 49 U/L (ref 38–126)
Anion gap: 10 (ref 5–15)
BUN: 20 mg/dL (ref 8–23)
CO2: 27 mmol/L (ref 22–32)
Calcium: 9.1 mg/dL (ref 8.9–10.3)
Chloride: 100 mmol/L (ref 98–111)
Creatinine, Ser: 0.8 mg/dL (ref 0.44–1.00)
GFR calc Af Amer: >60 mL/min (ref >60)
GFR calc non Af Amer: >60 mL/min (ref >60)
Glucose, Bld: 95 mg/dL (ref 70–99)
Potassium: 3.7 mmol/L (ref 3.5–5.1)
Sodium: 137 mmol/L (ref 135–145)
Total Bilirubin: 1 mg/dL (ref 0.3–1.2)
Total Protein: 6.6 g/dL (ref 6.5–8.1)

## 2019-09-25 LAB — ABO/RH: ABO/RH(D): O POS

## 2019-09-25 LAB — TYPE AND SCREEN
ABO/RH(D): O POS
Antibody Screen: NEGATIVE

## 2019-09-25 SURGERY — ARTHROPLASTY, KNEE, TOTAL
Anesthesia: Spinal | Site: Knee | Laterality: Right

## 2019-09-25 MED ORDER — METHOCARBAMOL 500 MG IVPB - SIMPLE MED: 500.0000 mg | Freq: Four times a day (QID) | INTRAVENOUS | Status: DC | PRN

## 2019-09-25 MED ORDER — ONDANSETRON HCL 4 MG PO TABS: 4.0000 mg | ORAL_TABLET | Freq: Four times a day (QID) | ORAL | Status: DC | PRN

## 2019-09-25 MED ORDER — METHOCARBAMOL 500 MG PO TABS: 500.0000 mg | ORAL_TABLET | Freq: Four times a day (QID) | ORAL | Status: DC | PRN

## 2019-09-25 MED ORDER — IRRISEPT - 450ML BOTTLE WITH 0.05% CHG IN STERILE WATER, USP 99.95% OPTIME
TOPICAL | Status: DC | PRN
  Administered 2019-09-25: 450 mL via TOPICAL

## 2019-09-25 MED ORDER — SODIUM CHLORIDE 0.9 % IV SOLN: INTRAVENOUS | Status: DC

## 2019-09-25 MED ORDER — DEXAMETHASONE SODIUM PHOSPHATE 10 MG/ML IJ SOLN
INTRAMUSCULAR | Status: AC
  Filled 2019-09-25: qty 1

## 2019-09-25 MED ORDER — CEFAZOLIN SODIUM-DEXTROSE 1-4 GM/50ML-% IV SOLN
1.0000 g | Freq: Four times a day (QID) | INTRAVENOUS | Status: DC
Start: 2019-09-25 — End: 2019-09-25

## 2019-09-25 MED ORDER — PROPOFOL 500 MG/50ML IV EMUL
INTRAVENOUS | Status: DC | PRN
  Administered 2019-09-25: 50 ug/kg/min via INTRAVENOUS

## 2019-09-25 MED ORDER — DIPHENHYDRAMINE HCL 12.5 MG/5ML PO ELIX: 12.5000 mg | ORAL_SOLUTION | ORAL | Status: DC | PRN

## 2019-09-25 MED ORDER — HYDROMORPHONE HCL 1 MG/ML IJ SOLN
INTRAMUSCULAR | Status: AC
  Administered 2019-09-25: 0.5 mg via INTRAVENOUS
  Filled 2019-09-25: qty 1

## 2019-09-25 MED ORDER — ACETAMINOPHEN 325 MG PO TABS
325.0000 mg | ORAL_TABLET | Freq: Four times a day (QID) | ORAL | Status: DC | PRN
  Administered 2019-09-27: 650 mg via ORAL
  Filled 2019-09-25: qty 2

## 2019-09-25 MED ORDER — CEFAZOLIN SODIUM-DEXTROSE 2-4 GM/100ML-% IV SOLN
2.0000 g | INTRAVENOUS | Status: AC
  Administered 2019-09-25: 2 g via INTRAVENOUS
  Filled 2019-09-25: qty 100

## 2019-09-25 MED ORDER — OXYCODONE HCL 5 MG PO TABS
10.0000 mg | ORAL_TABLET | ORAL | Status: DC | PRN
Start: 2019-09-25 — End: 2019-09-25

## 2019-09-25 MED ORDER — ONDANSETRON HCL 4 MG/2ML IJ SOLN
INTRAMUSCULAR | Status: AC
  Filled 2019-09-25: qty 2

## 2019-09-25 MED ORDER — SENNOSIDES-DOCUSATE SODIUM 8.6-50 MG PO TABS
1.0000 | ORAL_TABLET | Freq: Every evening | ORAL | Status: DC | PRN
Start: 2019-09-25 — End: 2019-09-25

## 2019-09-25 MED ORDER — ENOXAPARIN SODIUM 40 MG/0.4ML ~~LOC~~ SOLN
40.0000 mg | SUBCUTANEOUS | Status: DC
  Administered 2019-09-26 – 2019-09-27 (×2): 40 mg via SUBCUTANEOUS
  Filled 2019-09-25 (×2): qty 0.4

## 2019-09-25 MED ORDER — FENTANYL CITRATE (PF) 100 MCG/2ML IJ SOLN
25.0000 ug | INTRAMUSCULAR | Status: DC | PRN
  Administered 2019-09-25: 50 ug via INTRAVENOUS

## 2019-09-25 MED ORDER — OXYCODONE HCL 5 MG PO TABS: 5.0000 mg | ORAL_TABLET | ORAL | Status: DC | PRN

## 2019-09-25 MED ORDER — MIDAZOLAM HCL 2 MG/2ML IJ SOLN
INTRAMUSCULAR | Status: DC | PRN
  Administered 2019-09-25 (×2): 1 mg via INTRAVENOUS

## 2019-09-25 MED ORDER — CHLORHEXIDINE GLUCONATE 0.12 % MT SOLN
15.0000 mL | Freq: Once | OROMUCOSAL | Status: AC
  Administered 2019-09-25: 15 mL via OROMUCOSAL

## 2019-09-25 MED ORDER — METHOCARBAMOL 500 MG IVPB - SIMPLE MED
INTRAVENOUS | Status: AC
  Administered 2019-09-25: 500 mg via INTRAVENOUS
  Filled 2019-09-25: qty 50

## 2019-09-25 MED ORDER — BISACODYL 5 MG PO TBEC: 5.0000 mg | DELAYED_RELEASE_TABLET | Freq: Every day | ORAL | Status: DC | PRN

## 2019-09-25 MED ORDER — SODIUM CHLORIDE (PF) 0.9 % IJ SOLN
INTRAMUSCULAR | Status: AC
  Filled 2019-09-25: qty 50

## 2019-09-25 MED ORDER — TRAMADOL HCL 50 MG PO TABS
50.0000 mg | ORAL_TABLET | Freq: Four times a day (QID) | ORAL | Status: DC
  Administered 2019-09-25 – 2019-09-27 (×5): 50 mg via ORAL
  Filled 2019-09-25 (×5): qty 1

## 2019-09-25 MED ORDER — ONDANSETRON HCL 4 MG/2ML IJ SOLN: 4.0000 mg | Freq: Four times a day (QID) | INTRAMUSCULAR | Status: DC | PRN

## 2019-09-25 MED ORDER — LIDOCAINE 2% (20 MG/ML) 5 ML SYRINGE
INTRAMUSCULAR | Status: AC
  Filled 2019-09-25: qty 5

## 2019-09-25 MED ORDER — PHENYLEPHRINE HCL (PRESSORS) 10 MG/ML IV SOLN
INTRAVENOUS | Status: AC
  Filled 2019-09-25: qty 1

## 2019-09-25 MED ORDER — GABAPENTIN 300 MG PO CAPS
300.0000 mg | ORAL_CAPSULE | Freq: Three times a day (TID) | ORAL | Status: DC
Start: 2019-09-25 — End: 2019-09-25

## 2019-09-25 MED ORDER — METOCLOPRAMIDE HCL 5 MG/ML IJ SOLN: 5.0000 mg | Freq: Three times a day (TID) | INTRAMUSCULAR | Status: DC | PRN

## 2019-09-25 MED ORDER — DEXAMETHASONE SODIUM PHOSPHATE 10 MG/ML IJ SOLN
8.0000 mg | Freq: Once | INTRAMUSCULAR | Status: AC
  Administered 2019-09-25: 8 mg via INTRAVENOUS

## 2019-09-25 MED ORDER — TRAMADOL HCL 50 MG PO TABS: 50.0000 mg | ORAL_TABLET | Freq: Four times a day (QID) | ORAL | Status: DC

## 2019-09-25 MED ORDER — ACETAMINOPHEN 500 MG PO TABS
1000.0000 mg | ORAL_TABLET | Freq: Four times a day (QID) | ORAL | Status: AC
  Administered 2019-09-25 – 2019-09-26 (×4): 1000 mg via ORAL
  Filled 2019-09-25 (×4): qty 2

## 2019-09-25 MED ORDER — PROPOFOL 10 MG/ML IV BOLUS
INTRAVENOUS | Status: AC
  Filled 2019-09-25: qty 40

## 2019-09-25 MED ORDER — TRANEXAMIC ACID-NACL 1000-0.7 MG/100ML-% IV SOLN
1000.0000 mg | INTRAVENOUS | Status: AC
  Administered 2019-09-25: 1000 mg via INTRAVENOUS
  Filled 2019-09-25: qty 100

## 2019-09-25 MED ORDER — MAGNESIUM CITRATE PO SOLN: 1.0000 | Freq: Once | ORAL | Status: DC | PRN

## 2019-09-25 MED ORDER — BUPIVACAINE IN DEXTROSE 0.75-8.25 % IT SOLN
INTRATHECAL | Status: DC | PRN
  Administered 2019-09-25: 1.6 mL via INTRATHECAL

## 2019-09-25 MED ORDER — SODIUM CHLORIDE 0.9 % IR SOLN
Status: DC | PRN
  Administered 2019-09-25: 1000 mL

## 2019-09-25 MED ORDER — LACTATED RINGERS IV SOLN: INTRAVENOUS | Status: DC

## 2019-09-25 MED ORDER — METOCLOPRAMIDE HCL 5 MG PO TABS: 5.0000 mg | ORAL_TABLET | Freq: Three times a day (TID) | ORAL | Status: DC | PRN

## 2019-09-25 MED ORDER — OXYCODONE HCL 5 MG/5ML PO SOLN: 5.0000 mg | Freq: Once | ORAL | Status: DC | PRN

## 2019-09-25 MED ORDER — ORAL CARE MOUTH RINSE: 15.0000 mL | Freq: Once | OROMUCOSAL | Status: AC

## 2019-09-25 MED ORDER — FENTANYL CITRATE (PF) 100 MCG/2ML IJ SOLN
INTRAMUSCULAR | Status: AC
  Filled 2019-09-25: qty 2

## 2019-09-25 MED ORDER — OXYCODONE HCL 5 MG PO TABS: 5.0000 mg | ORAL_TABLET | Freq: Once | ORAL | Status: DC | PRN

## 2019-09-25 MED ORDER — METHOCARBAMOL 500 MG PO TABS
500.0000 mg | ORAL_TABLET | Freq: Four times a day (QID) | ORAL | Status: DC | PRN
  Administered 2019-09-25 – 2019-09-26 (×2): 500 mg via ORAL
  Filled 2019-09-25 (×2): qty 1

## 2019-09-25 MED ORDER — ENOXAPARIN SODIUM 40 MG/0.4ML ~~LOC~~ SOLN: 40.0000 mg | SUBCUTANEOUS | Status: DC

## 2019-09-25 MED ORDER — SENNOSIDES-DOCUSATE SODIUM 8.6-50 MG PO TABS: 1.0000 | ORAL_TABLET | Freq: Every evening | ORAL | Status: DC | PRN

## 2019-09-25 MED ORDER — ACETAMINOPHEN 325 MG PO TABS
325.0000 mg | ORAL_TABLET | Freq: Four times a day (QID) | ORAL | Status: DC | PRN
Start: 2019-09-26 — End: 2019-09-25

## 2019-09-25 MED ORDER — PROPOFOL 1000 MG/100ML IV EMUL
INTRAVENOUS | Status: AC
  Filled 2019-09-25: qty 100

## 2019-09-25 MED ORDER — 0.9 % SODIUM CHLORIDE (POUR BTL) OPTIME
TOPICAL | Status: DC | PRN
  Administered 2019-09-25: 1000 mL

## 2019-09-25 MED ORDER — EPHEDRINE SULFATE-NACL 50-0.9 MG/10ML-% IV SOSY
PREFILLED_SYRINGE | INTRAVENOUS | Status: DC | PRN
  Administered 2019-09-25 (×4): 5 mg via INTRAVENOUS

## 2019-09-25 MED ORDER — PROPOFOL 500 MG/50ML IV EMUL
INTRAVENOUS | Status: AC
  Filled 2019-09-25: qty 50

## 2019-09-25 MED ORDER — CEFAZOLIN SODIUM-DEXTROSE 1-4 GM/50ML-% IV SOLN
1.0000 g | Freq: Four times a day (QID) | INTRAVENOUS | Status: AC
  Administered 2019-09-25 (×3): 1 g via INTRAVENOUS
  Filled 2019-09-25 (×4): qty 50

## 2019-09-25 MED ORDER — METHOCARBAMOL 500 MG IVPB - SIMPLE MED
500.0000 mg | Freq: Four times a day (QID) | INTRAVENOUS | Status: DC | PRN
  Filled 2019-09-25: qty 50

## 2019-09-25 MED ORDER — FENTANYL CITRATE (PF) 100 MCG/2ML IJ SOLN
INTRAMUSCULAR | Status: DC | PRN
  Administered 2019-09-25 (×2): 50 ug via INTRAVENOUS

## 2019-09-25 MED ORDER — OXYCODONE HCL 5 MG PO TABS
10.0000 mg | ORAL_TABLET | ORAL | Status: DC | PRN
  Administered 2019-09-25: 10 mg via ORAL
  Filled 2019-09-25: qty 3
  Filled 2019-09-25: qty 2

## 2019-09-25 MED ORDER — SODIUM CHLORIDE 0.9 % IV SOLN
INTRAVENOUS | Status: DC
Start: 2019-09-25 — End: 2019-09-25

## 2019-09-25 MED ORDER — ROPIVACAINE HCL 7.5 MG/ML IJ SOLN
INTRAMUSCULAR | Status: DC | PRN
  Administered 2019-09-25: 20 mL via PERINEURAL

## 2019-09-25 MED ORDER — STERILE WATER FOR IRRIGATION IR SOLN
Status: DC | PRN
  Administered 2019-09-25: 2000 mL

## 2019-09-25 MED ORDER — MAGNESIUM CITRATE PO SOLN
1.0000 | Freq: Once | ORAL | Status: DC | PRN
Start: 2019-09-25 — End: 2019-09-25

## 2019-09-25 MED ORDER — HYDROMORPHONE HCL 1 MG/ML IJ SOLN: 0.5000 mg | INTRAMUSCULAR | Status: DC | PRN

## 2019-09-25 MED ORDER — ONDANSETRON HCL 4 MG/2ML IJ SOLN: 4.0000 mg | Freq: Once | INTRAMUSCULAR | Status: DC | PRN

## 2019-09-25 MED ORDER — POVIDONE-IODINE 10 % EX SWAB
2.0000 "application " | Freq: Once | CUTANEOUS | Status: AC
  Administered 2019-09-25: 2 via TOPICAL

## 2019-09-25 MED ORDER — GABAPENTIN 300 MG PO CAPS
300.0000 mg | ORAL_CAPSULE | Freq: Three times a day (TID) | ORAL | Status: DC
  Administered 2019-09-25 – 2019-09-27 (×5): 300 mg via ORAL
  Filled 2019-09-25 (×5): qty 1

## 2019-09-25 MED ORDER — OXYCODONE HCL 5 MG PO TABS
5.0000 mg | ORAL_TABLET | ORAL | Status: DC | PRN
  Administered 2019-09-25 – 2019-09-26 (×3): 10 mg via ORAL
  Filled 2019-09-25 (×2): qty 2

## 2019-09-25 MED ORDER — ZOLPIDEM TARTRATE 5 MG PO TABS: 5.0000 mg | ORAL_TABLET | Freq: Every evening | ORAL | Status: DC | PRN

## 2019-09-25 MED ORDER — SODIUM CHLORIDE (PF) 0.9 % IJ SOLN
INTRAMUSCULAR | Status: AC
  Filled 2019-09-25: qty 10

## 2019-09-25 MED ORDER — ONDANSETRON HCL 4 MG/2ML IJ SOLN
INTRAMUSCULAR | Status: DC | PRN
  Administered 2019-09-25: 4 mg via INTRAVENOUS

## 2019-09-25 MED ORDER — BUPIVACAINE LIPOSOME 1.3 % IJ SUSP
INTRAMUSCULAR | Status: DC | PRN
  Administered 2019-09-25: 20 mL

## 2019-09-25 MED ORDER — SODIUM CHLORIDE (PF) 0.9 % IJ SOLN
INTRAMUSCULAR | Status: DC | PRN
  Administered 2019-09-25: 60 mL via INTRAVENOUS

## 2019-09-25 MED ORDER — HYDROMORPHONE HCL 1 MG/ML IJ SOLN
0.5000 mg | INTRAMUSCULAR | Status: DC | PRN
  Administered 2019-09-25: 0.5 mg via INTRAVENOUS

## 2019-09-25 MED ORDER — LIDOCAINE 2% (20 MG/ML) 5 ML SYRINGE
INTRAMUSCULAR | Status: DC | PRN
  Administered 2019-09-25: 40 mg via INTRAVENOUS

## 2019-09-25 MED ORDER — ACETAMINOPHEN 500 MG PO TABS
1000.0000 mg | ORAL_TABLET | Freq: Four times a day (QID) | ORAL | Status: DC
Start: 2019-09-25 — End: 2019-09-25

## 2019-09-25 MED ORDER — MIDAZOLAM HCL 2 MG/2ML IJ SOLN
INTRAMUSCULAR | Status: AC
  Filled 2019-09-25: qty 2

## 2019-09-25 SURGICAL SUPPLY — 68 items
ADH SKN CLS APL DERMABOND .7 (GAUZE/BANDAGES/DRESSINGS) ×1
ATTUNE PS FEM RT SZ 7 CEM KNEE (Femur) ×2 IMPLANT
ATTUNE PSRP INSR SZ7 7 KNEE (Insert) ×1 IMPLANT
ATTUNE PSRP INSR SZ7 7MM KNEE (Insert) ×1 IMPLANT
BAG SPEC THK2 15X12 ZIP CLS (MISCELLANEOUS) ×1
BAG ZIPLOCK 12X15 (MISCELLANEOUS) ×3 IMPLANT
BASE TIBIAL ROT PLAT SZ 7 KNEE (Knees) IMPLANT
BLADE SAG 18X100X1.27 (BLADE) ×3 IMPLANT
BLADE SAW SGTL 11.0X1.19X90.0M (BLADE) ×3 IMPLANT
BNDG ELASTIC 4X5.8 VLCR STR LF (GAUZE/BANDAGES/DRESSINGS) ×5 IMPLANT
BNDG ELASTIC 6X5.8 VLCR STR LF (GAUZE/BANDAGES/DRESSINGS) ×3 IMPLANT
BOWL SMART MIX CTS (DISPOSABLE) ×3 IMPLANT
BSPLAT TIB 7 CMNT ROT PLAT STR (Knees) ×1 IMPLANT
CEMENT HV SMART SET (Cement) ×4 IMPLANT
COVER SURGICAL LIGHT HANDLE (MISCELLANEOUS) ×3 IMPLANT
COVER WAND RF STERILE (DRAPES) ×3 IMPLANT
CUFF TOURN SGL QUICK 34 (TOURNIQUET CUFF) ×3
CUFF TRNQT CYL 34X4.125X (TOURNIQUET CUFF) ×1 IMPLANT
DECANTER SPIKE VIAL GLASS SM (MISCELLANEOUS) ×5 IMPLANT
DERMABOND ADVANCED (GAUZE/BANDAGES/DRESSINGS) ×2
DERMABOND ADVANCED .7 DNX12 (GAUZE/BANDAGES/DRESSINGS) ×1 IMPLANT
DRAPE U-SHAPE 47X51 STRL (DRAPES) ×3 IMPLANT
DRSG AQUACEL AG ADV 3.5X10 (GAUZE/BANDAGES/DRESSINGS) ×3 IMPLANT
DRSG TEGADERM 4X4.75 (GAUZE/BANDAGES/DRESSINGS) ×3 IMPLANT
DURAPREP 26ML APPLICATOR (WOUND CARE) ×6 IMPLANT
ELECT REM PT RETURN 15FT ADLT (MISCELLANEOUS) ×3 IMPLANT
EVACUATOR 1/8 PVC DRAIN (DRAIN) ×3 IMPLANT
GAUZE SPONGE 2X2 8PLY STRL LF (GAUZE/BANDAGES/DRESSINGS) ×1 IMPLANT
GLOVE BIOGEL PI IND STRL 7.5 (GLOVE) ×1 IMPLANT
GLOVE BIOGEL PI IND STRL 8 (GLOVE) ×1 IMPLANT
GLOVE BIOGEL PI INDICATOR 7.5 (GLOVE) ×2
GLOVE BIOGEL PI INDICATOR 8 (GLOVE) ×2
GLOVE ECLIPSE 8.0 STRL XLNG CF (GLOVE) ×3 IMPLANT
GLOVE SURG ORTHO 9.0 STRL STRW (GLOVE) ×3 IMPLANT
GLOVE SURG SS PI 7.0 STRL IVOR (GLOVE) ×3 IMPLANT
GOWN STRL REUS W/TWL XL LVL3 (GOWN DISPOSABLE) ×6 IMPLANT
HANDPIECE INTERPULSE COAX TIP (DISPOSABLE) ×3
JET LAVAGE IRRISEPT WOUND (IRRIGATION / IRRIGATOR) ×3
KIT TURNOVER KIT A (KITS) ×3 IMPLANT
LAVAGE JET IRRISEPT WOUND (IRRIGATION / IRRIGATOR) ×1 IMPLANT
NS IRRIG 1000ML POUR BTL (IV SOLUTION) ×3 IMPLANT
PACK TOTAL KNEE CUSTOM (KITS) ×3 IMPLANT
PATELLA MEDIAL ATTUN 35MM KNEE (Knees) ×2 IMPLANT
PENCIL SMOKE EVACUATOR (MISCELLANEOUS) IMPLANT
PIN DRILL FIX HALF THREAD (BIT) ×2 IMPLANT
PIN STEINMAN FIXATION KNEE (PIN) ×2 IMPLANT
PROTECTOR NERVE ULNAR (MISCELLANEOUS) ×5 IMPLANT
SET HNDPC FAN SPRY TIP SCT (DISPOSABLE) ×1 IMPLANT
SET PAD KNEE POSITIONER (MISCELLANEOUS) ×3 IMPLANT
SPONGE GAUZE 2X2 STER 10/PKG (GAUZE/BANDAGES/DRESSINGS) ×2
SPONGE LAP 18X18 RF (DISPOSABLE) IMPLANT
SPONGE SURGIFOAM ABS GEL 100 (HEMOSTASIS) ×3 IMPLANT
STOCKINETTE 6  STRL (DRAPES) ×3
STOCKINETTE 6 STRL (DRAPES) ×1 IMPLANT
SUT BONE WAX W31G (SUTURE) IMPLANT
SUT MNCRL AB 3-0 PS2 18 (SUTURE) ×3 IMPLANT
SUT VIC AB 1 CT1 27 (SUTURE) ×9
SUT VIC AB 1 CT1 27XBRD ANTBC (SUTURE) ×3 IMPLANT
SUT VIC AB 2-0 CT1 27 (SUTURE) ×12
SUT VIC AB 2-0 CT1 TAPERPNT 27 (SUTURE) ×2 IMPLANT
SUT VLOC 180 0 24IN GS25 (SUTURE) ×3 IMPLANT
SYR 50ML LL SCALE MARK (SYRINGE) ×3 IMPLANT
TAPE STRIPS DRAPE STRL (GAUZE/BANDAGES/DRESSINGS) ×5 IMPLANT
TIBIAL BASE ROT PLAT SZ 7 KNEE (Knees) ×3 IMPLANT
TRAY FOLEY MTR SLVR 14FR STAT (SET/KITS/TRAYS/PACK) ×2 IMPLANT
WATER STERILE IRR 1000ML POUR (IV SOLUTION) ×6 IMPLANT
WRAP KNEE MAXI GEL POST OP (GAUZE/BANDAGES/DRESSINGS) ×3 IMPLANT
YANKAUER SUCT BULB TIP 10FT TU (MISCELLANEOUS) ×3 IMPLANT

## 2019-09-25 NOTE — Care Plan (Signed)
Ortho Bundle Case Management Note  Patient Details  Name: Chyenne Sobczak MRN: [id number] Date of Birth: 1950-03-26                  R TKA on 09-25-19 DCP: Home with mother. 1 story home with 4 ste. DME: RW ordered through [employer]. Has elevated toilets with grab bars. Does not need a 3-in-1. PT: [facility]. PT eval scheduled on 09-29-19.   DME Arranged:  Gilford Rile rolling DME Agency:  [employer]  HH Arranged:    [place] Agency:     Additional Comments: Please contact me with any questions of if this plan should need to change.  Marianne Sofia, RN,CCM [facility]  [phone] 09/25/2019, 8:16 AM

## 2019-09-25 NOTE — Op Note (Signed)
DATE OF SURGERY:  09/25/2019  TIME: 10:08 AM  PATIENT NAME:  Rebecca Morgan    AGE: 69 y.o.   PRE-OPERATIVE DIAGNOSIS:  Right knee osteoarthritis  POST-OPERATIVE DIAGNOSIS:  Right knee osteoarthritis  PROCEDURE:  Procedure(s): TOTAL KNEE ARTHROPLASTY  SURGEON:  Jaelon Gatley ANDREW  ASSISTANT:  Leeanne Haus, PA-C, present and scrubbed throughout the case, critical for assistance with exposure, retraction, instrumentation, and closure.  OPERATIVE IMPLANTS: Depuy PFC Attune Rotating Platform.  Femur size 7, Tibia size 7, Patella size 35 3-peg oval button, with a 7 mm polyethylene insert.   PREOPERATIVE INDICATIONS:   Rebecca Morgan is a 69 y.o. year old female with end stage bone on bone arthritis of the knee who failed conservative treatment and elected for Total Knee Arthroplasty.   The risks, benefits, and alternatives were discussed at length including but not limited to the risks of infection, bleeding, nerve injury, stiffness, blood clots, the need for revision surgery, cardiopulmonary complications, among others, and they were willing to proceed.  OPERATIVE DESCRIPTION:  The patient was brought to the operative room and placed in a supine position.  Spinal anesthesia was administered.  IV antibiotics were given.  The lower extremity was prepped and draped in the usual sterile fashion.  Time out was performed.  The leg was elevated and exsanguinated and the tourniquet was inflated.  Anterior quadriceps tendon splitting approach was performed.  The patella was retracted and osteophytes were removed.  The anterior horn of the medial and lateral meniscus was removed and cruciate ligaments resected.   The distal femur was opened with the drill and the intramedullary distal femoral cutting jig was utilized, set at 5 degrees resecting 10 mm off the distal femur.  Care was taken to protect the collateral ligaments.  The distal femoral sizing jig was applied, taking care to avoid  notching.  Then the 4-in-1 cutting jig was applied and the anterior and posterior femur was cut, along with the chamfer cuts.    Then the extramedullary tibial cutting jig was utilized making the appropriate cut using the anterior tibial crest as a reference building in appropriate posterior slope.  Care was taken during the cut to protect the medial and collateral ligaments.  The proximal tibia was removed along with the posterior horns of the menisci.   The posterior medial femoral osteophytes and posterior lateral femoral osteophytes were removed.    The flexion gap was then measured and was symmetric with the extension gap, measured at 7.  I completed the distal femoral preparation using the appropriate jig to prepare the box.  The patella was then measured, and cut with the saw.    The proximal tibia sized and prepared accordingly with the reamer and the punch, and then all components were trialed with the trial insert.  The knee was found to have excellent balance and full motion.    The above named components were then cemented into place and all excess cement was removed.  The trial polyethylene component was in place during cementation, and then was exchanged for the real polyethylene component.    The knee was easily taken through a range of motion and the patella tracked well and the knee irrigated copiously and the parapatellar and subcutaneous tissue closed with vicryl, and monocryl with steri strips for the skin.  The arthrotomy was closed at 90 of flexion. The wounds were dressed with sterile gauze and the tourniquet released and the patient was awakened and returned to the PACU in  stable and satisfactory condition.  There were no complications.  Total tourniquet time was 110 minutes.                DATE OF SURGERY:  09/25/2019  TIME: 10:08 AM  PATIENT NAME:  Rebecca Morgan    AGE: 69 y.o.   PRE-OPERATIVE DIAGNOSIS:  Right knee  osteoarthritis  POST-OPERATIVE DIAGNOSIS:  Right knee osteoarthritis  PROCEDURE:  Procedure(s): TOTAL KNEE ARTHROPLASTY  SURGEON:  Azuri Bozard ANDREW  ASSISTANT:  Leeanne Haus, PA-C, present and scrubbed throughout the case, critical for assistance with exposure, retraction, instrumentation, and closure.  OPERATIVE IMPLANTS: Depuy PFC Attune Rotating Platform.  Femur size 7, Tibia size 7, Patella size 35 3-peg oval button, with a 7 mm polyethylene insert.   PREOPERATIVE INDICATIONS:   Rebecca Morgan is a 69 y.o. year old female with end stage bone on bone arthritis of the knee who failed conservative treatment and elected for Total Knee Arthroplasty.   The risks, benefits, and alternatives were discussed at length including but not limited to the risks of infection, bleeding, nerve injury, stiffness, blood clots, the need for revision surgery, cardiopulmonary complications, among others, and they were willing to proceed.  OPERATIVE DESCRIPTION:  The patient was brought to the operative room and placed in a supine position.  Spinal anesthesia was administered.  IV antibiotics were given.  The lower extremity was prepped and draped in the usual sterile fashion.  Time out was performed.  The leg was elevated and exsanguinated and the tourniquet was inflated.  Anterior quadriceps tendon splitting approach was performed.  The patella was retracted and osteophytes were removed.  The anterior horn of the medial and lateral meniscus was removed and cruciate ligaments resected.   The distal femur was opened with the drill and the intramedullary distal femoral cutting jig was utilized, set at 5 degrees resecting 10 mm off the distal femur.  Care was taken to protect the collateral ligaments.  The distal femoral sizing jig was applied, taking care to avoid notching.  Then the 4-in-1 cutting jig was applied and the anterior and posterior femur was cut, along with the chamfer cuts.    Then the  extramedullary tibial cutting jig was utilized making the appropriate cut using the anterior tibial crest as a reference building in appropriate posterior slope.  Care was taken during the cut to protect the medial and collateral ligaments.  The proximal tibia was removed along with the posterior horns of the menisci.   The posterior medial femoral osteophytes and posterior lateral femoral osteophytes were removed.    The flexion gap was then measured and was symmetric with the extension gap, measured at 7.  I completed the distal femoral preparation using the appropriate jig to prepare the box.  The patella was then measured, and cut with the saw.    The proximal tibia sized and prepared accordingly with the reamer and the punch, and then all components were trialed with the trial insert.  The knee was found to have excellent balance and full motion.    The above named components were then cemented into place and all excess cement was removed.  The trial polyethylene component was in place during cementation, and then was exchanged for the real polyethylene component.    The knee was easily taken through a range of motion and the patella tracked well and the knee irrigated copiously and the parapatellar and subcutaneous tissue closed with vicryl, and monocryl with steri strips for the  skin.  The arthrotomy was closed at 90 of flexion. The wounds were dressed with sterile gauze and the tourniquet released and the patient was awakened and returned to the PACU in stable and satisfactory condition.  There were no complications.  Total tourniquet time was 110 minutes.

## 2019-09-25 NOTE — Transfer of Care (Signed)
Immediate Anesthesia Transfer of Care Note  Patient: Recie Cirrincione  Procedure(s) Performed: TOTAL KNEE ARTHROPLASTY (Right Knee)  Patient Location: PACU  Anesthesia Type:Spinal  Level of Consciousness: awake, alert  and patient cooperative  Airway & Oxygen Therapy: Patient Spontanous Breathing and Patient connected to face mask oxygen  Post-op Assessment: Report given to RN and Post -op Vital signs reviewed and stable  Post vital signs: Reviewed and stable  Last Vitals:  Vitals Value Taken Time  BP 114/56 09/25/19 1026  Temp    Pulse 60 09/25/19 1028  Resp 12 09/25/19 1028  SpO2 100 % 09/25/19 1028  Vitals shown include unvalidated device data.  Last Pain:  Vitals:   09/25/19 0612  TempSrc:   PainSc: 0-No pain      Patients Stated Pain Goal: 4 (38/75/64 3329)  Complications: No complications documented.

## 2019-09-25 NOTE — Anesthesia Procedure Notes (Signed)
Anesthesia Regional Block: Adductor canal block   Pre-Anesthetic Checklist: ,, timeout performed, Correct Patient, Correct Site, Correct Laterality, Correct Procedure, Correct Position, site marked, Risks and benefits discussed,  Surgical consent,  Pre-op evaluation,  At surgeon's request and post-op pain management  Laterality: Right  Prep: chloraprep       Needles:  Injection technique: Single-shot  Needle Type: Echogenic Needle     Needle Length: 10cm  Needle Gauge: 21     Additional Needles:   Narrative:  Start time: 09/25/2019 6:51 AM End time: 09/25/2019 6:55 AM Injection made incrementally with aspirations every 5 mL.  Performed by: Personally  Anesthesiologist: Audry Pili, MD  Additional Notes: No pain on injection. No increased resistance to injection. Injection made in 5cc increments. Good needle visualization. Patient tolerated the procedure well.

## 2019-09-25 NOTE — Anesthesia Procedure Notes (Signed)
Procedure Name: MAC Date/Time: 09/25/2019 7:41 AM Performed by: Eben Burow, CRNA Pre-anesthesia Checklist: Patient identified, Emergency Drugs available, Suction available, Patient being monitored and Timeout performed Oxygen Delivery Method: Simple face mask Placement Confirmation: positive ETCO2

## 2019-09-25 NOTE — Evaluation (Signed)
Physical Therapy Evaluation Patient Details Name: Rebecca Morgan MRN: [id number] DOB: [birth year] Today's Date: 09/25/2019   History of Present Illness  Patient is 69 y.o. female s/p Rt TKA on 09/25/19 with PMH significant for OA, HTN, breast cancer, hypothyroidism.  Clinical Impression  Rebecca Morgan is a 69 y.o. female POD 0 s/p Rt TKA. Patient reports independence with mobility at baseline. Patient is now limited by functional impairments (see PT problem list below) and requires min assist for transfers and gait with RW. Patient was able to ambulate ~18 feet with RW and min assist. Patient instructed in exercise to facilitate ROM and circulation. Patient will benefit from continued skilled PT interventions to address impairments and progress towards PLOF. Acute PT will follow to progress mobility and stair training in preparation for safe discharge home.     Follow Up Recommendations Follow surgeon's recommendation for DC plan and follow-up therapies;Outpatient PT    Equipment Recommendations  Rolling walker with 5" wheels    Recommendations for Other Services       Precautions / Restrictions Precautions Precautions: Fall Restrictions Weight Bearing Restrictions: No Other Position/Activity Restrictions: WBAT      Mobility  Bed Mobility Overal bed mobility: Needs Assistance Bed Mobility: Supine to Sit     Supine to sit: HOB elevated;Min assist        Transfers Overall transfer level: Needs assistance Equipment used: Rolling walker (2 wheeled) Transfers: Sit to/from Stand     Ambulation/Gait Ambulation/Gait assistance: Min assist Gait Distance (Feet): 16 Feet Assistive device: Rolling walker (2 wheeled) Gait Pattern/deviations: Step-to pattern;Decreased stride length;Decreased weight shift to right Gait velocity: decr   General Gait Details: cues for safe step pattern and proximity to RW, assist to manage walker position.  Stairs            Wheelchair  Mobility    Modified Rankin (Stroke Patients Only)       Balance Overall balance assessment: Needs assistance Sitting-balance support: Feet supported Sitting balance-Leahy Scale: Good     Standing balance support: During functional activity;Bilateral upper extremity supported Standing balance-Leahy Scale: Fair              Pertinent Vitals/Pain Pain Assessment: No/denies pain    Home Living Family/patient expects to be discharged to:: Private residence Living Arrangements: Parent Available Help at Discharge: Family Type of Home: House Home Access: Stairs to enter Entrance Stairs-Rails: Can reach both Entrance Stairs-Number of Steps: 3-4 Home Layout: One level Home Equipment: Cane - single point Additional Comments: pt's daughter is staying with her for a few days and other family can help as well    Prior Function Level of Independence: Independent               Hand Dominance   Dominant Hand: Right    Extremity/Trunk Assessment   Upper Extremity Assessment Upper Extremity Assessment: Overall WFL for tasks assessed    Lower Extremity Assessment Lower Extremity Assessment: Overall WFL for tasks assessed;RLE deficits/detail RLE Deficits / Details: pt with good quad acitvation, no extensor lag with SLR RLE Sensation: WNL RLE Coordination: WNL    Cervical / Trunk Assessment Cervical / Trunk Assessment: Normal  Communication   Communication: No difficulties  Cognition Arousal/Alertness: Awake/alert Behavior During Therapy: WFL for tasks assessed/performed Overall Cognitive Status: Within Functional Limits for tasks assessed         General Comments      Exercises Total Joint Exercises Ankle Circles/Pumps: AROM;Both;20 reps;Seated Quad Sets: AROM;Right;10 reps;Seated Heel Slides:  AROM;Right;10 reps;Seated   Assessment/Plan    PT Assessment Patient needs continued PT services  PT Problem List Decreased strength;Decreased range of  motion;Decreased activity tolerance;Decreased balance;Decreased mobility;Decreased knowledge of use of DME       PT Treatment Interventions DME instruction;Gait training;Stair training;Therapeutic activities;Therapeutic exercise;Functional mobility training;Balance training;Patient/family education    PT Goals (Current goals can be found in the Care Plan section)  Acute Rehab PT Goals Patient Stated Goal: return to independence PT Goal Formulation: With patient Time For Goal Achievement: 10/02/19 Potential to Achieve Goals: Good    Frequency 7X/week    AM-PAC PT "6 Clicks" Mobility  Outcome Measure Help needed turning from your back to your side while in a flat bed without using bedrails?: A Little Help needed moving from lying on your back to sitting on the side of a flat bed without using bedrails?: A Little Help needed moving to and from a bed to a chair (including a wheelchair)?: A Little Help needed standing up from a chair using your arms (e.g., wheelchair or bedside chair)?: A Little Help needed to walk in hospital room?: A Little Help needed climbing 3-5 steps with a railing? : A Little 6 Click Score: 18    End of Session Equipment Utilized During Treatment: Gait belt Activity Tolerance: Patient tolerated treatment well Patient left: in chair;with call bell/phone within reach;with chair alarm set;with family/visitor present Nurse Communication: Mobility status PT Visit Diagnosis: Muscle weakness (generalized) (M62.81);Difficulty in walking, not elsewhere classified (R26.2)    Time: 9150-5697 PT Time Calculation (min) (ACUTE ONLY): 27 min   Charges:   PT Evaluation $PT Eval Low Complexity: 1 Low PT Treatments $Therapeutic Exercise: 8-22 mins        Verner Mould, DPT Acute Rehabilitation Services  Office [phone] Pager [phone]  09/25/2019 4:44 PM

## 2019-09-25 NOTE — Anesthesia Procedure Notes (Signed)
Spinal  Patient location during procedure: OR Start time: 09/25/2019 7:42 AM End time: 09/25/2019 7:46 AM Staffing Performed: anesthesiologist  Anesthesiologist: Audry Pili, MD Preanesthetic Checklist Completed: patient identified, IV checked, risks and benefits discussed, surgical consent, monitors and equipment checked, pre-op evaluation and timeout performed Spinal Block Patient position: sitting Prep: DuraPrep Patient monitoring: heart rate, cardiac monitor, continuous pulse ox and blood pressure Approach: midline Location: L3-4 Injection technique: single-shot Needle Needle type: Pencan  Needle gauge: 24 G Additional Notes Consent was obtained prior to the procedure with all questions answered and concerns addressed. Risks including, but not limited to, bleeding, infection, nerve damage, paralysis, failed block, inadequate analgesia, allergic reaction, high spinal, itching, and headache were discussed and the patient wished to proceed. Functioning IV was confirmed and monitors were applied. Sterile prep and drape, including hand hygiene, mask, and sterile gloves were used. The patient was positioned and the spine was prepped. The skin was anesthetized with lidocaine. Free flow of clear CSF was obtained prior to injecting local anesthetic into the CSF. The spinal needle aspirated freely following injection. The needle was carefully withdrawn. The patient tolerated the procedure well.   Renold Don, MD

## 2019-09-25 NOTE — Interval H&P Note (Signed)
History and Physical Interval Note:  09/25/2019 7:36 AM  [place]  has presented today for surgery, with the diagnosis of Right knee osteoarthritis.  The various methods of treatment have been discussed with the patient and family. After consideration of risks, benefits and other options for treatment, the patient has consented to  Procedure(s) with comments: TOTAL KNEE ARTHROPLASTY (Right) - adductor canal as a surgical intervention.  The patient's history has been reviewed, patient examined, no change in status, stable for surgery.  I have reviewed the patient's chart and labs.  Questions were answered to the patient's satisfaction.     Rebecca Morgan ANDREW

## 2019-09-25 NOTE — Anesthesia Postprocedure Evaluation (Signed)
Anesthesia Post Note  Patient: Rebecca Morgan  Procedure(s) Performed: TOTAL KNEE ARTHROPLASTY (Right Knee)     Patient location during evaluation: PACU Anesthesia Type: Spinal Level of consciousness: awake and alert Pain management: pain level controlled Vital Signs Assessment: post-procedure vital signs reviewed and stable Respiratory status: spontaneous breathing and respiratory function stable Cardiovascular status: blood pressure returned to baseline and stable Postop Assessment: spinal receding and no apparent nausea or vomiting Anesthetic complications: no   No complications documented.  Last Vitals:  Vitals:   09/25/19 1145 09/25/19 1217  BP: 130/61 136/72  Pulse: (!) 53 (!) 55  Resp: 12 15  Temp:    SpO2: 99% 100%    Last Pain:  Vitals:   09/25/19 1130  TempSrc:   PainSc: [place]

## 2019-09-26 LAB — HIV ANTIBODY (ROUTINE TESTING W REFLEX): HIV Screen 4th Generation wRfx: NONREACTIVE

## 2019-09-26 MED ORDER — METHOCARBAMOL 500 MG PO TABS: 500.0000 mg | ORAL_TABLET | Freq: Four times a day (QID) | ORAL | 0 refills | Status: AC

## 2019-09-26 MED ORDER — ONDANSETRON HCL 4 MG PO TABS: 4.0000 mg | ORAL_TABLET | Freq: Three times a day (TID) | ORAL | 1 refills | Status: AC | PRN

## 2019-09-26 MED ORDER — OXYCODONE HCL 5 MG PO TABS: 5.0000 mg | ORAL_TABLET | ORAL | 0 refills | Status: AC | PRN

## 2019-09-26 MED ORDER — SODIUM CHLORIDE 0.9 % IV BOLUS
250.0000 mL | Freq: Once | INTRAVENOUS | Status: AC
  Administered 2019-09-26: 250 mL via INTRAVENOUS

## 2019-09-26 NOTE — TOC Initial Note (Signed)
Transition of Care [facility]) - Initial/Assessment Note    Patient Details  Name: Rebecca Morgan MRN: [id number] Date of Birth: [birth year]  Transition of Care [facility]) CM/SW Contact:    Joaquin Courts, RN Phone Number: 09/26/2019, 11:01 AM  Clinical Narrative:  CM spoke with patient at bedside.  [employer] to deliver rolling walker to bedside for home use.  Patient set up for OPPT.                    Expected Discharge Plan: Home/Self Care Barriers to Discharge: No Barriers Identified   Patient Goals and CMS Choice Patient states their goals for this hospitalization and ongoing recovery are:: to go home      Expected Discharge Plan and Services Expected Discharge Plan: Home/Self Care   Discharge Planning Services: CM Consult   Living arrangements for the past 2 months: Single Family Home Expected Discharge Date: 09/26/19               DME Arranged: Gilford Rile rolling DME Agency: [employer] Date DME Agency Contacted: 09/26/19 Time DME Agency Contacted: 69 Representative spoke with at DME Agency: [place]: NA [place] Agency: NA        Prior Living Arrangements/Services Living arrangements for the past 2 months: [place]   Patient language and need for interpreter reviewed:: Yes Do you feel safe going back to the place where you live?: Yes      Need for Family Participation in Patient Care: Yes (Comment) Care giver support system in place?: Yes (comment)   Criminal Activity/Legal Involvement Pertinent to Current Situation/Hospitalization: No - Comment as needed  Activities of Daily Living Home Assistive Devices/Equipment: Cane (specify quad or straight), Eyeglasses, Dentures (specify type) ADL Screening (condition at time of admission) Patient's cognitive ability adequate to safely complete daily activities?: Yes Is the patient deaf or have difficulty hearing?: No Does the patient have difficulty seeing, even when wearing glasses/contacts?: No Does the  patient have difficulty concentrating, remembering, or making decisions?: No Patient able to express need for assistance with ADLs?: Yes Does the patient have difficulty dressing or bathing?: No Independently performs ADLs?: Yes (appropriate for developmental age) Does the patient have difficulty walking or climbing stairs?: No Weakness of Legs: Right Weakness of Arms/Hands: None  Permission Sought/Granted                  Emotional Assessment Appearance:: Appears stated age Attitude/Demeanor/Rapport: Engaged Affect (typically observed): Accepting Orientation: : Oriented to Self, Oriented to Place, Oriented to  Time, Oriented to Situation   Psych Involvement: No (comment)  Admission diagnosis:  Osteoarthritis of right knee [M17.11] Patient Active Problem List   Diagnosis Date Noted  . Osteoarthritis of right knee 09/25/2019  . Sleep disorder 10/07/2012   PCP:  Dixie Dials, MD Pharmacy:   [facility] [address] [place] [address] Phone: [phone] Fax: [phone]     Social Determinants of Health (SDOH) Interventions    Readmission Risk Interventions No flowsheet data found.

## 2019-09-26 NOTE — Progress Notes (Signed)
Physical Therapy Treatment Patient Details Name: Rebecca Morgan MRN: [id number] DOB: 1950-07-21 Today's Date: 09/26/2019    History of Present Illness Patient is 69 y.o. female s/p Rt TKA on 09/25/19 with PMH significant for OA, HTN, breast cancer, hypothyroidism.    PT Comments    Pt still feeling cognitively "unclear", and per daughter pt appears altered vs baseline with response time, attention, and overall awareness. Pt with improved ambulation distance this day, ambulating hallway distance with use of RW. No dizziness noted this day. Pt tolerated continued TKR exercises, limited by fatigue. PT recommending additional night stay for improved mobility prior to d/c home, pt and family on board for d/c tomorrow. RN notified.    Follow Up Recommendations  Follow surgeon's recommendation for DC plan and follow-up therapies;Outpatient PT     Equipment Recommendations  Rolling walker with 5" wheels    Recommendations for Other Services       Precautions / Restrictions Precautions Precautions: Fall Restrictions Weight Bearing Restrictions: No Other Position/Activity Restrictions: WBAT    Mobility  Bed Mobility Overal bed mobility: Needs Assistance             General bed mobility comments: up in chair and requesting stay in chair upon PT exit  Transfers Overall transfer level: Needs assistance Equipment used: Rolling walker (2 wheeled) Transfers: Sit to/from Stand Sit to Stand: Min guard         General transfer comment: for safety, verbal cuing for hand placeemnt when rising and sitting.  Ambulation/Gait Ambulation/Gait assistance: Min guard Gait Distance (Feet): 60 Feet Assistive device: Rolling walker (2 wheeled) Gait Pattern/deviations: Step-through pattern;Decreased stride length;Trunk flexed Gait velocity: decr   General Gait Details: Min guard for safety, verbal cuing for placement in RW, upright posture, relaxing shoulders as pt keeping shoulders  elevated.   Stairs             Wheelchair Mobility    Modified Rankin (Stroke Patients Only)       Balance Overall balance assessment: Needs assistance Sitting-balance support: Feet supported Sitting balance-Leahy Scale: Good     Standing balance support: During functional activity;Bilateral upper extremity supported Standing balance-Leahy Scale: Fair                              Cognition Arousal/Alertness: Awake/alert Behavior During Therapy: WFL for tasks assessed/performed Overall Cognitive Status: Impaired/Different from baseline Area of Impairment: Problem solving;Attention                   Current Attention Level: Sustained         Problem Solving: Slow processing;Requires tactile cues;Requires verbal cues General Comments: pt trailing off multiple times when answering PT questions, requires prompting to finish. Pt states she feels "unclear" cognitively, and per daughter in room pt not at cognitive baseline post-op      Exercises Total Joint Exercises Ankle Circles/Pumps: AROM;Both;10 reps;Seated Quad Sets: AROM;Right;10 reps;Supine Knee Flexion: AAROM;Right;10 reps;Seated (to 90* sitting with PT facilitation)    General Comments        Pertinent Vitals/Pain Pain Assessment: 0-10 Pain Score: 1  Pain Location: R knee Pain Descriptors / Indicators: Sore Pain Intervention(s): Limited activity within patient's tolerance;Monitored during session;Repositioned    Home Living                      Prior Function            PT Goals (  current goals can now be found in the care plan section) Acute Rehab PT Goals Patient Stated Goal: return to independence PT Goal Formulation: With patient Time For Goal Achievement: 10/02/19 Potential to Achieve Goals: Good Progress towards PT goals: Progressing toward goals    Frequency    7X/week      PT Plan Current plan remains appropriate    Co-evaluation               AM-PAC PT "6 Clicks" Mobility   Outcome Measure  Help needed turning from your back to your side while in a flat bed without using bedrails?: A Little Help needed moving from lying on your back to sitting on the side of a flat bed without using bedrails?: A Little Help needed moving to and from a bed to a chair (including a wheelchair)?: A Little Help needed standing up from a chair using your arms (e.g., wheelchair or bedside chair)?: A Little Help needed to walk in hospital room?: A Little Help needed climbing 3-5 steps with a railing? : A Little 6 Click Score: 18    End of Session Equipment Utilized During Treatment: Gait belt Activity Tolerance: Patient limited by fatigue Patient left: in chair;with call bell/phone within reach;with chair alarm set;with family/visitor present Nurse Communication: Mobility status PT Visit Diagnosis: Muscle weakness (generalized) (M62.81);Difficulty in walking, not elsewhere classified (R26.2)     Time: 0623-7628 PT Time Calculation (min) (ACUTE ONLY): 24 min  Charges:  $Gait Training: 8-22 mins $Therapeutic Exercise: 8-22 mins                     Makilah Dowda E, PT Acute Rehabilitation Services Pager [phone]  Office [phone]    Gal Smolinski D Elonda Husky 09/26/2019, 3:55 PM

## 2019-09-26 NOTE — Progress Notes (Signed)
Physical Therapy Treatment Patient Details Name: Rebecca Morgan MRN: [id number] DOB: 04/06/1950 Today's Date: 09/26/2019    History of Present Illness Patient is 69 y.o. female s/p Rt TKA on 09/25/19 with PMH significant for OA, HTN, breast cancer, hypothyroidism.    PT Comments    Pt with short ambulation distance this session with close guard for safety, limited in distance by dizziness related to hypotension 70/38, HR 76. Pt with recovery to 144/69(91), HR 69 bpm upon return to supine. Pt tolerated TKA exercises well, will see pt for second session for ambulation progression and stair training. RN notified of BP response to activity.    Follow Up Recommendations  Follow surgeon's recommendation for DC plan and follow-up therapies;Outpatient PT     Equipment Recommendations  Rolling walker with 5" wheels    Recommendations for Other Services       Precautions / Restrictions Precautions Precautions: Fall Restrictions Weight Bearing Restrictions: No Other Position/Activity Restrictions: WBAT    Mobility  Bed Mobility Overal bed mobility: Needs Assistance Bed Mobility: Supine to Sit;Sit to Supine     Supine to sit: Supervision;HOB elevated Sit to supine: Min assist;HOB elevated   General bed mobility comments: supervision for supine to sit for safety, increased time and effort. min assist for return to supine for RLE lifting and placement in bed.  Transfers Overall transfer level: Needs assistance Equipment used: Rolling walker (2 wheeled) Transfers: Sit to/from Stand Sit to Stand: Supervision         General transfer comment: for safety, verbal cuing for hand placeemnt when rising and sitting.  Ambulation/Gait Ambulation/Gait assistance: Min guard Gait Distance (Feet): 20 Feet Assistive device: Rolling walker (2 wheeled) Gait Pattern/deviations: Step-through pattern;Decreased stride length;Trunk flexed;Antalgic Gait velocity: decr   General Gait Details:  min guard for safety during walking to and from bathroom, initiating hallway ambulation. Verbal cuing for upright posture, placement in RW. Pt limited by dizziness, hypotension.   Stairs             Wheelchair Mobility    Modified Rankin (Stroke Patients Only)       Balance Overall balance assessment: Needs assistance Sitting-balance support: Feet supported Sitting balance-Leahy Scale: Good     Standing balance support: During functional activity;Bilateral upper extremity supported Standing balance-Leahy Scale: Fair                              Cognition Arousal/Alertness: Awake/alert Behavior During Therapy: WFL for tasks assessed/performed Overall Cognitive Status: Within Functional Limits for tasks assessed                                 General Comments: drowsy as pt sleeping upon PT arrival to room      Exercises Total Joint Exercises Ankle Circles/Pumps: AROM;Both;10 reps;Supine Quad Sets: AROM;Right;10 reps;Supine Heel Slides: Right;10 reps;AAROM;Supine Hip ABduction/ADduction: AAROM;Right;10 reps;Supine Straight Leg Raises: AROM;Right;10 reps;Supine Goniometric ROM: knee aarom 10-90*    General Comments        Pertinent Vitals/Pain Pain Assessment: No/denies pain    Home Living                      Prior Function            PT Goals (current goals can now be found in the care plan section) Acute Rehab PT Goals Patient Stated Goal: return to independence  PT Goal Formulation: With patient Time For Goal Achievement: 10/02/19 Potential to Achieve Goals: Good Progress towards PT goals: Progressing toward goals    Frequency    7X/week      PT Plan Current plan remains appropriate    Co-evaluation              AM-PAC PT "6 Clicks" Mobility   Outcome Measure  Help needed turning from your back to your side while in a flat bed without using bedrails?: A Little Help needed moving from lying on  your back to sitting on the side of a flat bed without using bedrails?: A Little Help needed moving to and from a bed to a chair (including a wheelchair)?: A Little Help needed standing up from a chair using your arms (e.g., wheelchair or bedside chair)?: A Little Help needed to walk in hospital room?: A Little Help needed climbing 3-5 steps with a railing? : A Little 6 Click Score: 18    End of Session Equipment Utilized During Treatment: Gait belt Activity Tolerance: Treatment limited secondary to medical complications (Comment) (hypotension) Patient left: in chair;with call bell/phone within reach;with chair alarm set;with family/visitor present Nurse Communication: Mobility status PT Visit Diagnosis: Muscle weakness (generalized) (M62.81);Difficulty in walking, not elsewhere classified (R26.2)     Time: 7078-6754 PT Time Calculation (min) (ACUTE ONLY): 27 min  Charges:  $Gait Training: 8-22 mins $Therapeutic Exercise: 8-22 mins                     Iline Buchinger E, PT [place] Pager [phone]  Office [phone]    Keaton Stirewalt D Elonda Husky 09/26/2019, 10:47 AM

## 2019-09-26 NOTE — Progress Notes (Signed)
Subjective: 1 Day Post-Op Procedure(s) (LRB): TOTAL KNEE ARTHROPLASTY (Right) Patient reports pain as 5 on 0-10 scale.   Pain well controlled on PO meds No events over night + void/ flatus  Objective: Vital signs in last 24 hours: Temp:  [97 F (36.1 C)-97.8 F (36.6 C)] 97.8 F (36.6 C) (07/17 0624) Pulse Rate:  [50-72] 68 (07/17 0624) Resp:  [4-18] 16 (07/17 0624) BP: (108-142)/(56-77) 126/64 (07/17 0624) SpO2:  [91 %-100 %] 97 % (07/17 0624)  Intake/Output from previous day: 07/16 0701 - 07/17 0700 In: 3835.8 [P.O.:1260; I.V.:2235.8; IV Piggyback:340] Out: 2850 [Urine:2300; Drains:500; Blood:50] Intake/Output this shift: No intake/output data recorded.  No results for input(s): HGB in the last 72 hours. No results for input(s): WBC, RBC, HCT, PLT in the last 72 hours. Recent Labs    09/25/19 0558  NA 137  K 3.7  CL 100  CO2 27  BUN 20  CREATININE 0.80  GLUCOSE 95  CALCIUM 9.1   Recent Labs    09/25/19 0558  INR 1.1  Hemovac drain at time of removal: 15 mL  Neurologically intact ABD soft Neurovascular intact Sensation intact distally Intact pulses distally Dorsiflexion/Plantar flexion intact Incision: dressing C/D/I No cellulitis present Compartment soft   Assessment/Plan: 1 Day Post-Op Procedure(s) (LRB): TOTAL KNEE ARTHROPLASTY (Right) Advance diet Up with therapy Plan for D/c home today after 1 more session with PT this am Meds sent to pharmacy  DVT ppx: ASA 325 mg Tabs BID   Patient's anticipated LOS is less than 2 midnights, meeting these requirements: - Younger than 53 - Lives within 1 hour of care - Has a competent adult at home to recover with post-op recover - NO history of  - Chronic pain requiring opiods  - Diabetes  - Coronary Artery Disease  - Heart failure  - Heart attack  - Stroke  - DVT/VTE  - Cardiac arrhythmia  - Respiratory Failure/COPD  - Renal failure  - Anemia  - Advanced Liver disease       Derrick Ravel [phone] 09/26/2019, 7:55 AM

## 2019-09-27 MED ORDER — SODIUM CHLORIDE 0.9 % IV BOLUS
250.0000 mL | Freq: Once | INTRAVENOUS | Status: AC
  Administered 2019-09-27: 250 mL via INTRAVENOUS

## 2019-09-27 NOTE — Progress Notes (Signed)
Physical Therapy Treatment Patient Details Name: Rebecca Morgan MRN: [id number] DOB: [birth year] Today's Date: 09/27/2019    History of Present Illness Patient is 69 y.o. female s/p Rt TKA on 09/25/19 with PMH significant for OA, HTN, breast cancer, hypothyroidism.    PT Comments    Pt up with nurse tech on arrival and able to demonstrate significantly improved gait and performs stairs this session. Pt educated for HEP, continued progression and encouraged to walk with nursing staff.    Follow Up Recommendations  Follow surgeon's recommendation for DC plan and follow-up therapies;Outpatient PT     Equipment Recommendations  Rolling walker with 5" wheels    Recommendations for Other Services       Precautions / Restrictions Precautions Precautions: Fall;Knee Restrictions Weight Bearing Restrictions: Yes RLE Weight Bearing: Weight bearing as tolerated    Mobility  Bed Mobility               General bed mobility comments: pt on BSC on arrial and chair end of session  Transfers Overall transfer level: Needs assistance   Transfers: Sit to/from Stand Sit to Stand: Min guard         General transfer comment: cues for hand placement  Ambulation/Gait Ambulation/Gait assistance: Supervision Gait Distance (Feet): 250 Feet Assistive device: Rolling walker (2 wheeled) Gait Pattern/deviations: Step-through pattern;Decreased stride length   Gait velocity interpretation: >2.62 ft/sec, indicative of community ambulatory General Gait Details: cues for proximity to RW and direction to room   Stairs Stairs: Yes Stairs assistance: Min guard Stair Management: Step to pattern;Forwards;Two rails Number of Stairs: 4 General stair comments: pt able to complete stairs with use of rail without assist   Wheelchair Mobility    Modified Rankin (Stroke Patients Only)       Balance Overall balance assessment: No apparent balance deficits (not formally assessed)                                           Cognition Arousal/Alertness: Awake/alert Behavior During Therapy: WFL for tasks assessed/performed Overall Cognitive Status: Within Functional Limits for tasks assessed                                        Exercises Total Joint Exercises Hip ABduction/ADduction: AAROM;Right;Seated;15 reps Straight Leg Raises: AROM;Right;Seated;10 reps Long Arc Quad: AROM;Right;Seated;15 reps Goniometric ROM: 10-90 extension limited by body habitus Marching in Standing: AROM;Right;Seated;15 reps    General Comments        Pertinent Vitals/Pain Pain Assessment: No/denies pain    Home Living                      Prior Function            PT Goals (current goals can now be found in the care plan section) Progress towards PT goals: Progressing toward goals    Frequency    7X/week      PT Plan Current plan remains appropriate    Co-evaluation              AM-PAC PT "6 Clicks" Mobility   Outcome Measure  Help needed turning from your back to your side while in a flat bed without using bedrails?: A Little Help needed moving from lying on your back to sitting  on the side of a flat bed without using bedrails?: A Little Help needed moving to and from a bed to a chair (including a wheelchair)?: A Little Help needed standing up from a chair using your arms (e.g., wheelchair or bedside chair)?: A Little Help needed to walk in hospital room?: A Little Help needed climbing 3-5 steps with a railing? : None 6 Click Score: 19    End of Session Equipment Utilized During Treatment: Gait belt Activity Tolerance: Patient tolerated treatment well Patient left: in chair;with call bell/phone within reach Nurse Communication: Mobility status PT Visit Diagnosis: Muscle weakness (generalized) (M62.81);Difficulty in walking, not elsewhere classified (R26.2);Other abnormalities of gait and mobility (R26.89)     Time:  0840-0900 PT Time Calculation (min) (ACUTE ONLY): 20 min  Charges:  $Gait Training: 8-22 mins                     Bayard Males, PT Acute Rehabilitation Services Pager: [phone] Office: [place] 09/27/2019, 12:53 PM

## 2019-09-27 NOTE — Progress Notes (Signed)
Patient discharged to home w/ family. Given all belongings, instructions, equipment. Verbalized understanding of instructions. Escorted to pov via w/c. 

## 2019-09-27 NOTE — Progress Notes (Signed)
Patient had a MEWS score of 2 due to HR of 111. Patient was resting comfortable in bed and denied pain. She stated she has episodes of increased heart since since she has lost weight and that her doctor is adjusting her medication. She also stated her Lopressor medication was changed to every 3 days and she had a dose this morning. She is due for f/u with her doctor next month. Will continue to monitor her V/S as per MEWS protocol.

## 2019-09-27 NOTE — Discharge Summary (Signed)
Physician Discharge Summary  Patient ID: Rebecca Morgan MRN: [id number] DOB/AGE: September 10, 1950 69 y.o.  Admit date: 09/25/2019 Discharge date: 09/27/2019  Admission Diagnoses: R knee OA; hx of sleep disorder, HTN, hypothyroidism, hyperlipiedemia,   Discharge Diagnoses:  Active Problems:   Osteoarthritis of right knee same as above  Discharged Condition: stable  Hospital Course: Patient presented to [facility] on 09/25/19 for elective R TKR by Dr. Theda Sers.  The patient tolerated the procedure well without complication.  She was admitted to the hospital for pain control and to work with therapy.  She worked with with therapy.  She has been dealing with elevated HR since recent weight loss.  She is being treated by her PCP for elevated HR.  HR remained elevated, but stable, during hospital stay.  Patient is to be D/C'd home on 09/27/19.  Consults: PT/OT  Significant Diagnostic Studies: radiology: X-Ray: for diagnostic purposes.  Treatments: IV hydration, antibiotics: Ancef, analgesia: acetaminophen, Dilaudid and oxycodone, hydrocodone, anticoagulation: lovenox and surgery: as stated above.  Discharge Exam: Blood pressure (!) 119/57, pulse (!) 101, temperature 98.2 F (36.8 C), temperature source Oral, resp. rate 17, height 5\' 5"  (1.651 m), weight 98 kg, SpO2 93 %. General: WDWN patient in NAD. Psych:  Appropriate mood and affect. Neuro:  A&O x 3, Moving all extremities, sensation intact to light touch HEENT:  EOMs intact Chest:  Even non-labored respirations Skin:  Aquacel dressing C/D/I, no rashes or lesions Extremities: warm/dry, mild edema to R knee, no erythema or echymosis.  No lymphadenopathy. Pulses: Popliteus 2+ MSK:  ROM: lacks 5 degrees TKE, MMT: able to perform quad set, (-) Homan's   Disposition: Discharge disposition: 01-Home or Self Care       Discharge Instructions    Call MD / Call 911   Complete by: As directed    If you experience chest pain or shortness  of breath, CALL 911 and be transported to the hospital emergency room.  If you develope a fever above 101 F, pus (white drainage) or increased drainage or redness at the wound, or calf pain, call your surgeon's office.   Call MD / Call 911   Complete by: As directed    If you experience chest pain or shortness of breath, CALL 911 and be transported to the hospital emergency room.  If you develope a fever above 101 F, pus (white drainage) or increased drainage or redness at the wound, or calf pain, call your surgeon's office.   Constipation Prevention   Complete by: As directed    Drink plenty of fluids.  Prune juice may be helpful.  You may use a stool softener, such as Colace (over the counter) 100 mg twice a day.  Use MiraLax (over the counter) for constipation as needed.   Constipation Prevention   Complete by: As directed    Drink plenty of fluids.  Prune juice may be helpful.  You may use a stool softener, such as Colace (over the counter) 100 mg twice a day.  Use MiraLax (over the counter) for constipation as needed.   Diet - low sodium heart healthy   Complete by: As directed    Diet - low sodium heart healthy   Complete by: As directed    Discharge instructions   Complete by: As directed    Dr. Sydnee Cabal [facility] [address], [place], [place] [address] [phone]  TOTAL KNEE REPLACEMENT POSTOPERATIVE DIRECTIONS  Knee Rehabilitation, Guidelines Following Surgery  Results after knee  surgery are often greatly improved when you follow the exercise, range of motion and muscle strengthening exercises prescribed by your doctor. Safety measures are also important to protect the knee from further injury. Any time any of these exercises cause you to have increased pain or swelling in your knee joint, decrease the amount until you are comfortable again and slowly increase them. If you have problems or questions, call your caregiver or physical therapist for advice.    HOME CARE INSTRUCTIONS  Remove items at home which could result in a fall. This includes throw rugs or furniture in walking pathways.  ICE to the affected knee every three hours for 30 minutes at a time and then as needed for pain and swelling.  Continue to use ice on the knee for pain and swelling from surgery. You may notice swelling that will progress down to the foot and ankle.  This is normal after surgery.  Elevate the leg when you are not up walking on it.   Continue to use the breathing machine which will help keep your temperature down.  It is common for your temperature to cycle up and down following surgery, especially at night when you are not up moving around and exerting yourself.  The breathing machine keeps your lungs expanded and your temperature down. Do not place pillow under knee, focus on keeping the knee straight while resting   DIET You may resume your previous home diet once your are discharged from the hospital.  DRESSING / WOUND CARE / SHOWERING Keep the surgical dressing until follow up.  The dressing is water proof, but you need to put extra covering over it like plastic wrap.  IF THE DRESSING FALLS OFF or the wound gets wet inside, change the dressing with sterile gauze.  Please use good hand washing techniques before changing the dressing.  Do not use any lotions or creams on the incision until instructed by your surgeon.   You may start showering once you are discharged home but do not submerge the incision under water.  You are sent home with an ACE bandage on over the leg, this can be removed at 3 days from surgery. At this time you can start showering. Please place the white TED stocking on the surgical leg after. This needs to be worn on the surgical leg for 2 weeks after surgery.   ACTIVITY Walk with your walker as instructed. Use walker as long as suggested by your caregivers. Avoid periods of inactivity such as sitting longer than an hour when not asleep.  This helps prevent blood clots.  You may resume a sexual relationship in one month or when given the OK by your doctor.  You may return to work once you are cleared by your doctor.  Do not drive a car for 6 weeks or until released by you surgeon.  Do not drive while taking narcotics.  WEIGHT BEARING Weight bearing as tolerated with assist device (walker, cane, etc) as directed, use it as long as suggested by your surgeon or therapist, typically at least 4-6 weeks.  POSTOPERATIVE CONSTIPATION PROTOCOL Constipation - defined medically as fewer than three stools per week and severe constipation as less than one stool per week.  One of the most common issues patients have following surgery is constipation.  Even if you have a regular bowel pattern at home, your normal regimen is likely to be disrupted due to multiple reasons following surgery.  Combination of anesthesia, postoperative narcotics, change in appetite  and fluid intake all can affect your bowels.  In order to avoid complications following surgery, here are some recommendations in order to help you during your recovery period.  Colace (docusate) - Pick up an over-the-counter form of Colace or another stool softener and take twice a day as long as you are requiring postoperative pain medications.  Take with a full glass of water daily.  If you experience loose stools or diarrhea, hold the colace until you stool forms back up.  If your symptoms do not get better within 1 week or if they get worse, check with your doctor.  Dulcolax (bisacodyl) - Pick up over-the-counter and take as directed by the product packaging as needed to assist with the movement of your bowels.  Take with a full glass of water.  Use this product as needed if not relieved by Colace only.   MiraLax (polyethylene glycol) - Pick up over-the-counter to have on hand.  MiraLax is a solution that will increase the amount of water in your bowels to assist with bowel movements.   Take as directed and can mix with a glass of water, juice, soda, coffee, or tea.  Take if you go more than two days without a movement. Do not use MiraLax more than once per day. Call your doctor if you are still constipated or irregular after using this medication for 7 days in a row.  If you continue to have problems with postoperative constipation, please contact the office for further assistance and recommendations.  If you experience "the worst abdominal pain ever" or develop nausea or vomiting, please contact the office immediatly for further recommendations for treatment.  ITCHING  If you experience itching with your medications, try taking only a single pain pill, or even half a pain pill at a time.  You can also use Benadryl over the counter for itching or also to help with sleep.   TED HOSE STOCKINGS Wear the elastic stockings on both legs for two weeks following surgery during the day but you may remove then at night for sleeping.  Okay to remove ACE in 3 days, put TED on after  MEDICATIONS See your medication summary on the "After Visit Summary" that the nursing staff will review with you prior to discharge.  You may have some home medications which will be placed on hold until you complete the course of blood thinner medication.  It is important for you to complete the blood thinner medication as prescribed by your surgeon.  Continue your approved medications as instructed at time of discharge. If you were not previously taking any blood thinners prior to surgery please start taking Aspirin 325 mg tabs twice daily for 6 weeks. If you are unable to take Aspirin please let your doctor know.   PRECAUTIONS If you experience chest pain or shortness of breath - call 911 immediately for transfer to the hospital emergency department.  If you develop a fever greater that 101 F, purulent drainage from wound, increased redness or drainage from wound, foul odor from the wound/dressing, or calf  pain - CONTACT YOUR SURGEON.                                                   FOLLOW-UP APPOINTMENTS Make sure you keep all of your appointments after your operation with your surgeon and caregivers.  You should call the office at the above phone number and make an appointment for approximately two weeks after the date of your surgery or on the date instructed by your surgeon outlined in the "After Visit Summary".   RANGE OF MOTION AND STRENGTHENING EXERCISES  Rehabilitation of the knee is important following a knee injury or an operation. After just a few days of immobilization, the muscles of the thigh which control the knee become weakened and shrink (atrophy). Knee exercises are designed to build up the tone and strength of the thigh muscles and to improve knee motion. Often times heat used for twenty to thirty minutes before working out will loosen up your tissues and help with improving the range of motion but do not use heat for the first two weeks following surgery. These exercises can be done on a training (exercise) mat, on the floor, on a table or on a bed. Use what ever works the best and is most comfortable for you Knee exercises include:  Leg Lifts - While your knee is still immobilized in a splint or cast, you can do straight leg raises. Lift the leg to 60 degrees, hold for 3 sec, and slowly lower the leg. Repeat 10-20 times 2-3 times daily. Perform this exercise against resistance later as your knee gets better.  Quad and Hamstring Sets - Tighten up the muscle on the front of the thigh (Quad) and hold for 5-10 sec. Repeat this 10-20 times hourly. Hamstring sets are done by pushing the foot backward against an object and holding for 5-10 sec. Repeat as with quad sets.  Leg Slides: Lying on your back, slowly slide your foot toward your buttocks, bending your knee up off the floor (only go as far as is comfortable). Then slowly slide your foot back down until your leg is flat on the floor  again. Angel Wings: Lying on your back spread your legs to the side as far apart as you can without causing discomfort.  A rehabilitation program following serious knee injuries can speed recovery and prevent re-injury in the future due to weakened muscles. Contact your doctor or a physical therapist for more information on knee rehabilitation.   IF YOU ARE TRANSFERRED TO A SKILLED REHAB FACILITY If the patient is transferred to a skilled rehab facility following release from the hospital, a list of the current medications will be sent to the facility for the patient to continue.  When discharged from the skilled rehab facility, please have the facility set up the patient's [place] prior to being released. Also, the skilled facility will be responsible for providing the patient with their medications at time of release from the facility to include their pain medication, the muscle relaxants, and their blood thinner medication. If the patient is still at the rehab facility at time of the two week follow up appointment, the skilled rehab facility will also need to assist the patient in arranging follow up appointment in our office and any transportation needs.  MAKE SURE YOU:  Understand these instructions.  Get help right away if you are not doing well or get worse.    Pick up stool softner and laxative for home use following surgery while on pain medications. May shower starting three days after surgery. Please use a clean towel to pat the leg dry following showers. Continue to use ice for pain and swelling after surgery. Do not use any lotions or creams on the incision until instructed by you  Start Aspirin immediately following surgery.   Do not put a pillow under the knee. Place it under the heel.   Complete by: As directed    Driving restrictions   Complete by: As directed    No driving for two weeks   Increase activity slowly as tolerated   Complete by: As directed     TED hose   Complete by: As directed    Use stockings (TED hose) for three weeks on both leg(s).  You may remove them at night for sleeping.   Weight bearing as tolerated   Complete by: As directed      Allergies as of 09/27/2019   No Known Allergies     Medication List    STOP taking these medications   ibuprofen 800 MG tablet Commonly known as: ADVIL     TAKE these medications   CALCIUM 1200+D3 PO Take 3 tablets by mouth daily.   hydrochlorothiazide 12.5 MG tablet Commonly known as: HYDRODIURIL Take 12.5 mg by mouth every Monday, Wednesday, and Friday.   levothyroxine 112 MCG tablet Commonly known as: SYNTHROID Take 112 mcg by mouth daily before breakfast.   methocarbamol 500 MG tablet Commonly known as: Robaxin Take 1 tablet (500 mg total) by mouth 4 (four) times daily.   metoprolol succinate 25 MG 24 hr tablet Commonly known as: TOPROL-XL Take 25 mg by mouth daily.   multivitamin with minerals Tabs tablet Take 1 tablet by mouth daily.   ondansetron 4 MG tablet Commonly known as: Zofran Take 1 tablet (4 mg total) by mouth every 8 (eight) hours as needed for nausea or vomiting.   oxyCODONE 5 MG immediate release tablet Commonly known as: Roxicodone Take 1 tablet (5 mg total) by mouth every 4 (four) hours as needed for up to 7 days for severe pain.   potassium chloride 10 MEQ CR capsule Commonly known as: MICRO-K Take 10 mEq by mouth daily.            Durable Medical Equipment  (From admission, onward)         Start     Ordered   09/26/19 1009  For home use only DME Walker rolling  Once       Question Answer Comment  Walker: With 5 Inch Wheels   Patient needs a walker to treat with the following condition Total knee replacement status, right      09/26/19 1008           Discharge Care Instructions  (From admission, onward)         Start     Ordered   09/26/19 0000  Weight bearing as tolerated        09/26/19 0758           Follow-up Information    [facility] Go on 09/29/2019.   Why: You are scheduled for PT eval on Tuesday July 20th at 3:45pm. Contact information: [address] [place] [address] [phone]        Sydnee Cabal, MD. Go on 10/09/2019.   Specialty: Orthopedic Surgery Why: You are scheduled for your first post op appointment on Friday July 30th at 4:00pm. Contact information: [address] [place] [address] [place] [address] [phone]               Signed: Mohammed Kindle Office:  [phone]

## 2019-09-27 NOTE — Progress Notes (Signed)
Subjective: 2 Days Post-Op Procedure(s) (LRB): TOTAL KNEE ARTHROPLASTY (Right)  Patient reports pain as mild to moderate.  Patient reports that she feels much better today.  Denies fever, chills, N/V, CP, SOB.  Tolerating POs well.  Admits to flatus. She reports that she has struggled with elevated HR since losing wt.  This is a condition that she and her PCP are managing.  She notes that she would like to go home today.  Objective:   VITALS:  Temp:  [97.9 F (36.6 C)-99.1 F (37.3 C)] 98.9 F (37.2 C) (07/18 0543) Pulse Rate:  [73-111] 100 (07/18 0543) Resp:  [16-20] 20 (07/18 0543) BP: (86-130)/(42-70) 130/70 (07/18 0543) SpO2:  [92 %-99 %] 99 % (07/18 0543)  General: WDWN patient in NAD. Psych:  Appropriate mood and affect. Neuro:  A&O x 3, Moving all extremities, sensation intact to light touch HEENT:  EOMs intact Chest:  Even non-labored respirations Skin:  Aquacel dressing C/D/I, no rashes or lesions Extremities: warm/dry, mild edema to R knee, no erythema or echymosis.  No lymphadenopathy. Pulses: Popliteus 2+ MSK:  ROM: lacks 5 degrees TKE, MMT: able to perform quad set, (-) Homan's    LABS No results for input(s): HGB, WBC, PLT in the last 72 hours. Recent Labs    09/25/19 0558  NA 137  K 3.7  CL 100  CO2 27  BUN 20  CREATININE 0.80  GLUCOSE 95   Recent Labs    09/25/19 0558  INR 1.1     Assessment/Plan: 2 Days Post-Op Procedure(s) (LRB): TOTAL KNEE ARTHROPLASTY (Right)  -Patient seen in rounds for Dr. Theda Sers -WBAT R LE -Up with therapy -If she does well with therapy and her HR remains stable will plan to D/C home later this evening.   -D/C scripts have already been sent to patient's pharmacy. -Plan for outpatient post-op visit with Dr. Theda Sers.  Mechele Claude PA-C [facility] Office:  [phone]

## 2019-09-28 ENCOUNTER — Encounter (HOSPITAL_COMMUNITY): Payer: Self-pay | Admitting: Specialist

## 2019-12-24 ENCOUNTER — Ambulatory Visit: Payer: Medicare HMO

## 2020-02-03 ENCOUNTER — Other Ambulatory Visit: Payer: Self-pay

## 2020-02-03 ENCOUNTER — Ambulatory Visit
Admission: RE | Admit: 2020-02-03 | Discharge: 2020-02-03 | Disposition: A | Discharge status: Home / Self Care | Payer: Medicare HMO | Source: Ambulatory Visit | Attending: Obstetrics and Gynecology | Admitting: Obstetrics and Gynecology

## 2020-02-03 DIAGNOSIS — Z1231 Encounter for screening mammogram for malignant neoplasm of breast: Secondary | ICD-10-CM

## 2020-02-03 HISTORY — DX: Personal history of irradiation: Z92.3

## 2020-02-03 HISTORY — DX: Personal history of antineoplastic chemotherapy: Z92.21

## 2020-05-27 ENCOUNTER — Other Ambulatory Visit: Payer: Self-pay | Admitting: Obstetrics and Gynecology

## 2020-05-27 DIAGNOSIS — N63 Unspecified lump in unspecified breast: Secondary | ICD-10-CM

## 2020-06-17 ENCOUNTER — Other Ambulatory Visit: Payer: Self-pay

## 2020-06-17 ENCOUNTER — Other Ambulatory Visit: Payer: Self-pay | Admitting: Obstetrics and Gynecology

## 2020-06-17 ENCOUNTER — Ambulatory Visit
Admission: RE | Admit: 2020-06-17 | Discharge: 2020-06-17 | Disposition: A | Discharge status: Home / Self Care | Payer: Medicare HMO | Source: Ambulatory Visit | Attending: Obstetrics and Gynecology | Admitting: Obstetrics and Gynecology

## 2020-06-17 DIAGNOSIS — N63 Unspecified lump in unspecified breast: Secondary | ICD-10-CM

## 2020-06-22 ENCOUNTER — Other Ambulatory Visit: Payer: Self-pay

## 2020-06-22 ENCOUNTER — Ambulatory Visit
Admission: RE | Admit: 2020-06-22 | Discharge: 2020-06-22 | Disposition: A | Discharge status: Home / Self Care | Payer: Medicare HMO | Source: Ambulatory Visit | Attending: Obstetrics and Gynecology | Admitting: Obstetrics and Gynecology

## 2020-06-22 ENCOUNTER — Other Ambulatory Visit: Payer: Self-pay | Admitting: Obstetrics and Gynecology

## 2020-06-22 DIAGNOSIS — N63 Unspecified lump in unspecified breast: Secondary | ICD-10-CM

## 2020-06-22 HISTORY — PX: BREAST BIOPSY: SHX20

## 2020-12-28 ENCOUNTER — Other Ambulatory Visit: Payer: Self-pay | Admitting: Obstetrics and Gynecology

## 2020-12-28 DIAGNOSIS — Z1231 Encounter for screening mammogram for malignant neoplasm of breast: Secondary | ICD-10-CM

## 2021-02-07 ENCOUNTER — Ambulatory Visit: Payer: Medicare HMO

## 2021-03-14 ENCOUNTER — Ambulatory Visit
Admission: RE | Admit: 2021-03-14 | Discharge: 2021-03-14 | Disposition: A | Discharge status: Home / Self Care | Payer: Medicare HMO | Source: Ambulatory Visit | Attending: Obstetrics and Gynecology | Admitting: Obstetrics and Gynecology

## 2021-03-14 DIAGNOSIS — Z1231 Encounter for screening mammogram for malignant neoplasm of breast: Secondary | ICD-10-CM

## 2021-03-17 ENCOUNTER — Other Ambulatory Visit: Payer: Self-pay | Admitting: Cardiovascular Disease

## 2021-03-17 DIAGNOSIS — N6310 Unspecified lump in the right breast, unspecified quadrant: Secondary | ICD-10-CM

## 2021-03-29 ENCOUNTER — Ambulatory Visit
Admission: RE | Admit: 2021-03-29 | Discharge: 2021-03-29 | Disposition: A | Discharge status: Home / Self Care | Payer: Medicare HMO | Source: Ambulatory Visit | Attending: Cardiovascular Disease | Admitting: Cardiovascular Disease

## 2021-03-29 DIAGNOSIS — N6310 Unspecified lump in the right breast, unspecified quadrant: Secondary | ICD-10-CM

## 2021-07-02 IMAGING — MG MM BREAST LOCALIZATION CLIP
4 series · 4 of 12 positions shown · non-contrast
Comparison: Previous exam(s).

CLINICAL DATA: Status post ultrasound-guided biopsy for RIGHT
breast mass.

EXAM:
DIAGNOSTIC RIGHT MAMMOGRAM POST ULTRASOUND BIOPSY

[R ML synth-2D]
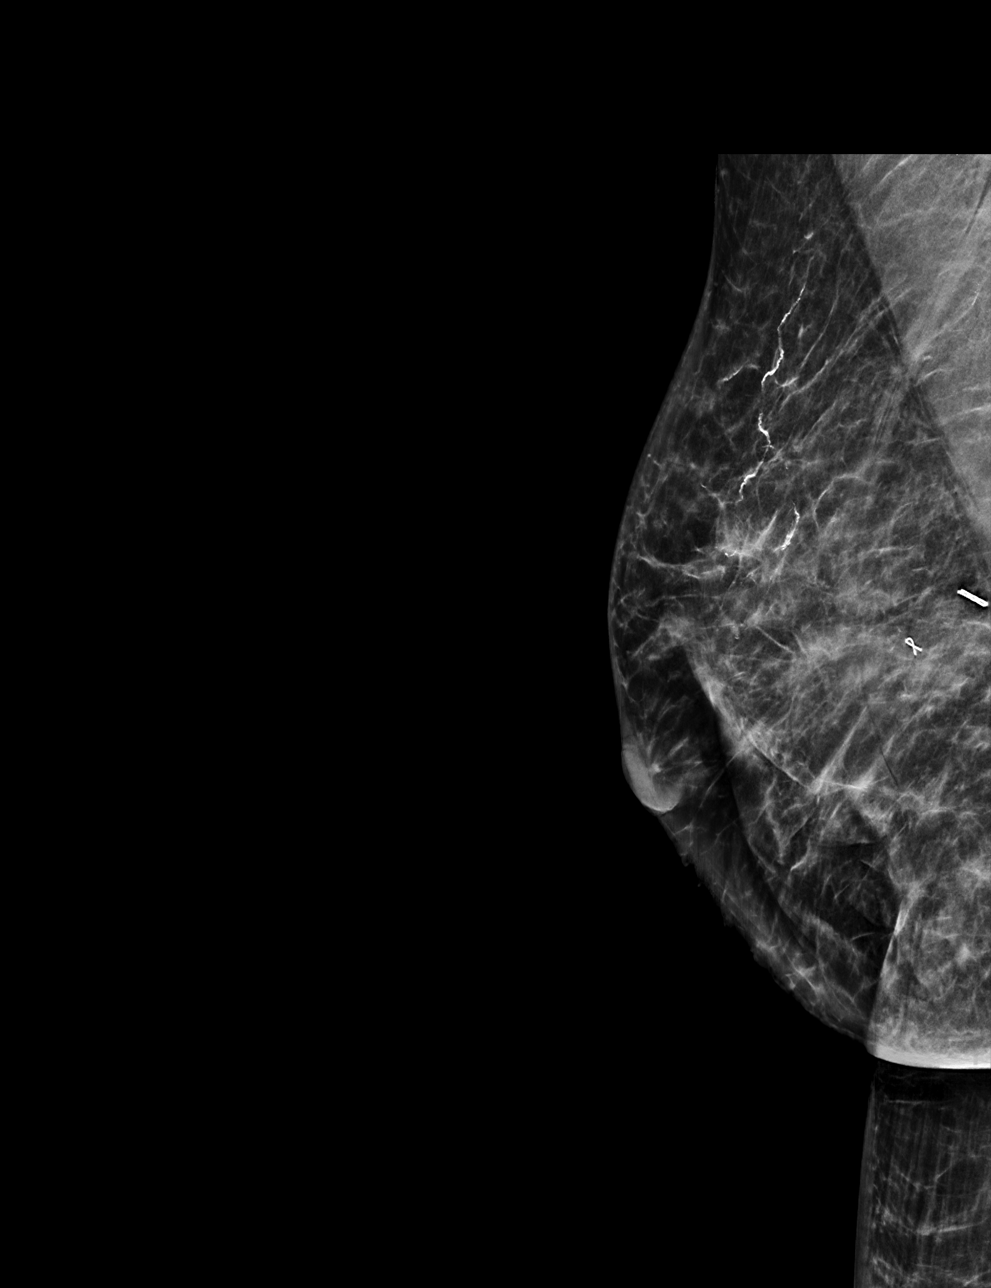

[R CC synth-2D]
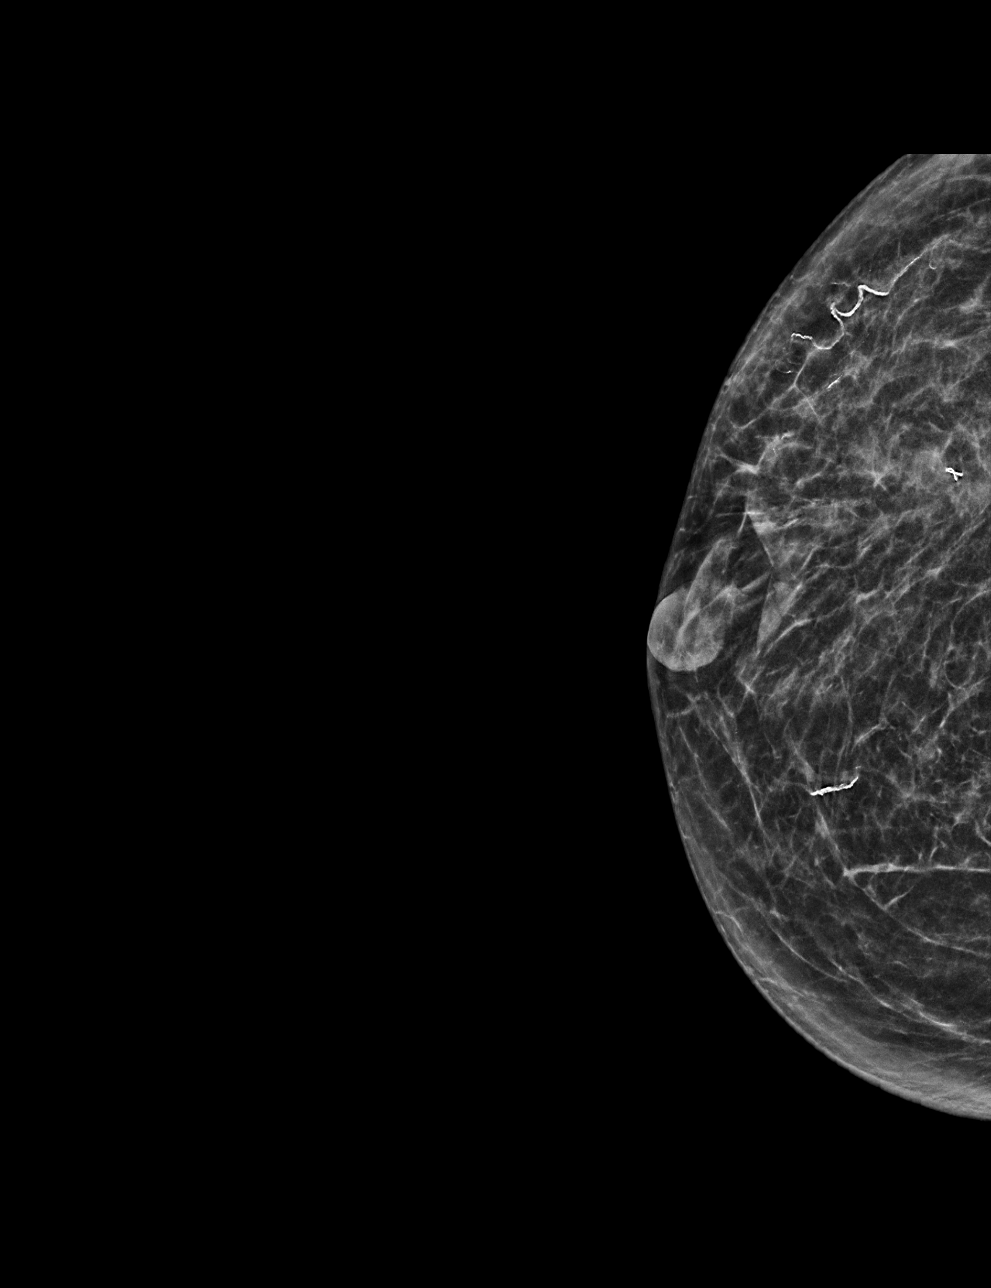

[R ML tomo · tomo slice 31/60.0]
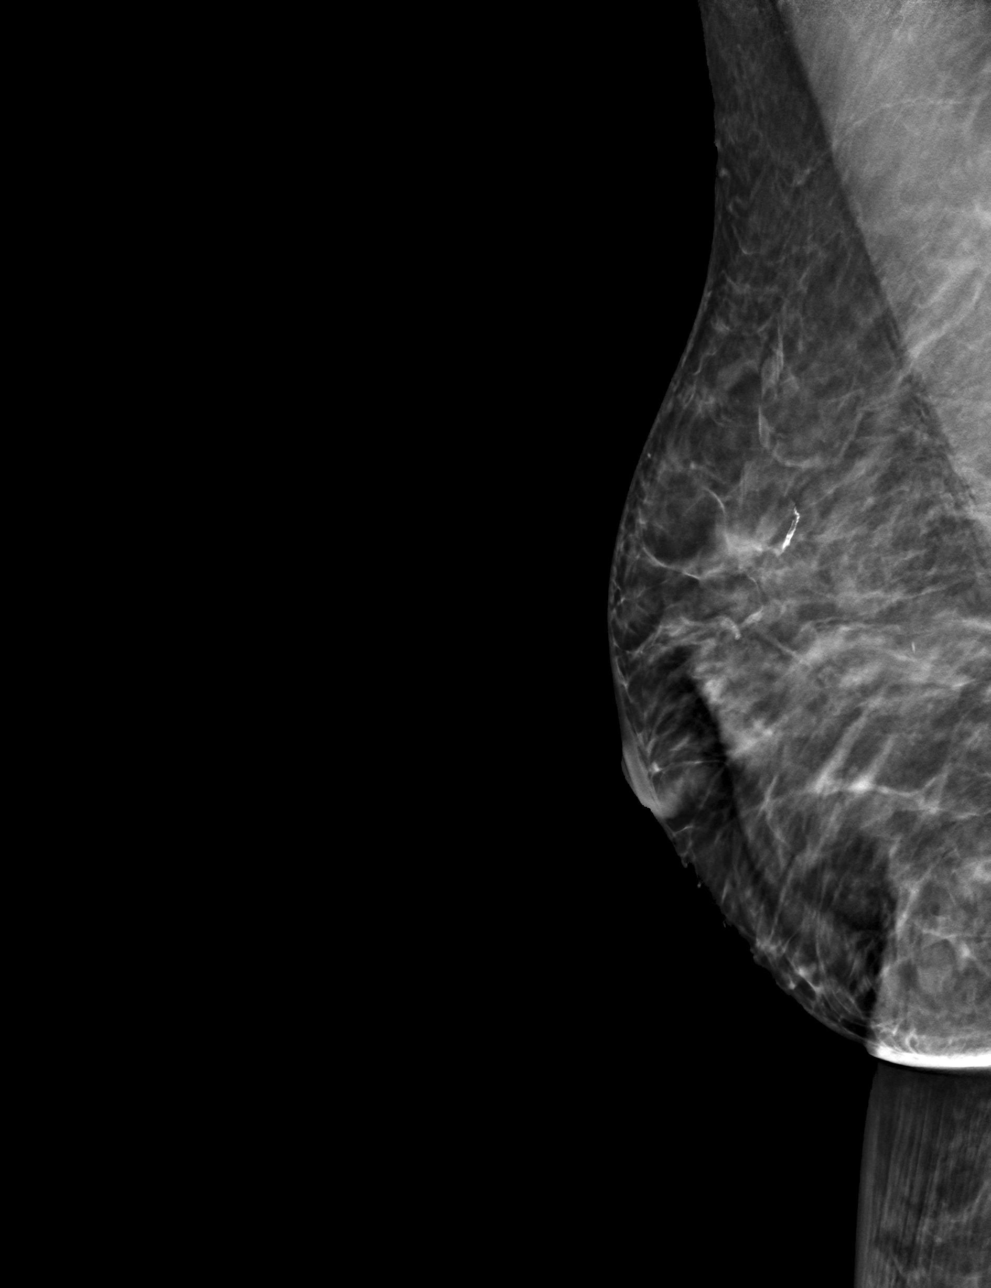

[R CC tomo · tomo slice 24/47.0]
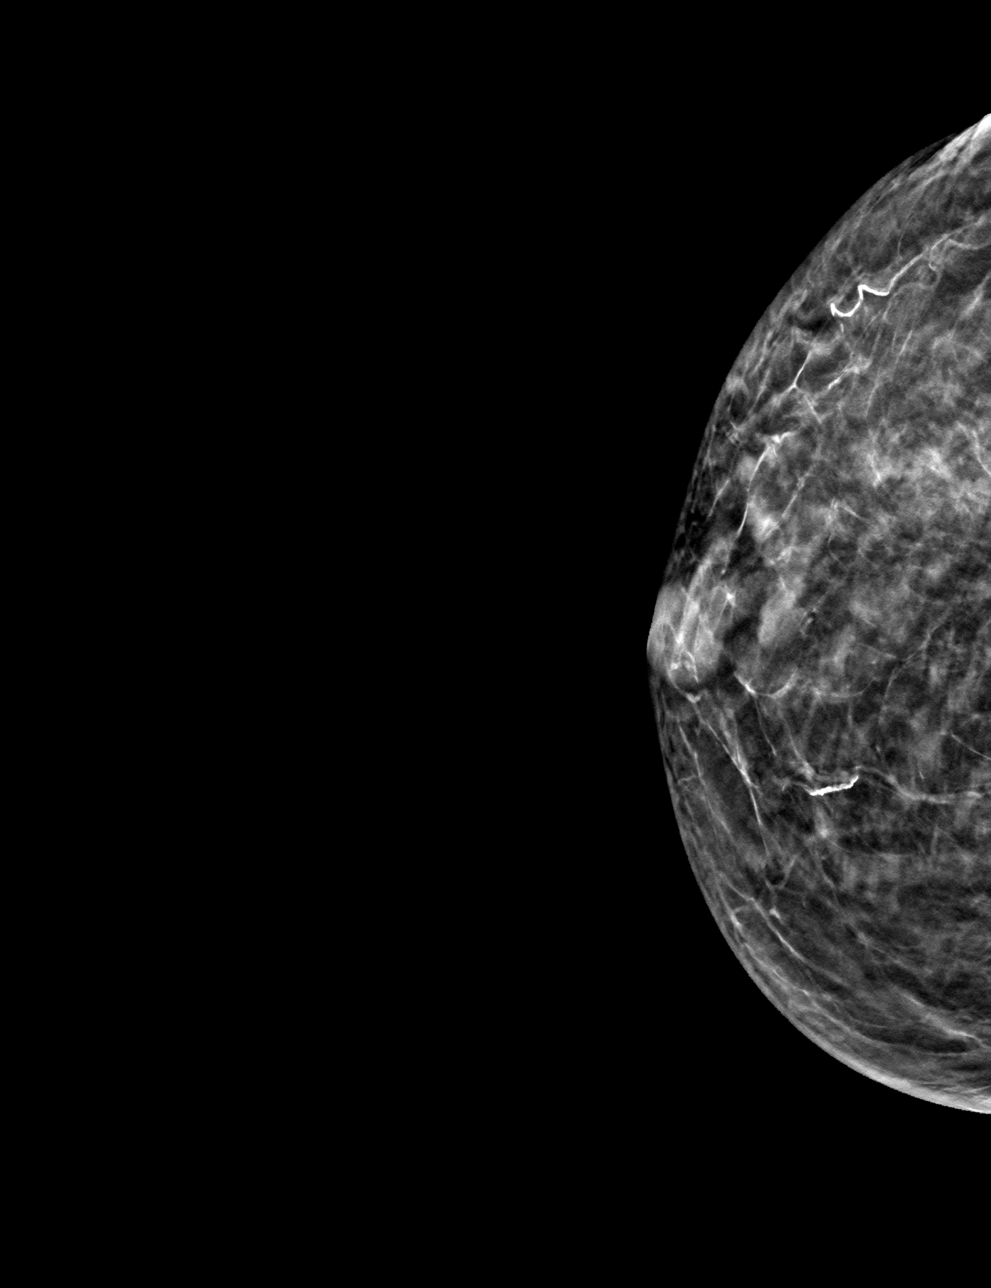

[4 of 12 positions shown; findings below may reference images not displayed]

FINDINGS: Mammographic images were obtained following ultrasound guided biopsy
of a RIGHT breast mass at the 8 o'clock axis. The biopsy marking
clip is in expected position at the site of biopsy.
IMPRESSION: Appropriate positioning of the ribbon shaped biopsy marking clip at
the site of biopsy in the outer RIGHT breast corresponding to the
targeted mass at the 8 o'clock axis.

Final Assessment: Post Procedure Mammograms for Marker Placement

## 2022-04-18 ENCOUNTER — Other Ambulatory Visit: Payer: Self-pay | Admitting: Obstetrics and Gynecology

## 2022-04-18 DIAGNOSIS — Z1231 Encounter for screening mammogram for malignant neoplasm of breast: Secondary | ICD-10-CM

## 2022-04-24 ENCOUNTER — Ambulatory Visit
Admission: RE | Admit: 2022-04-24 | Discharge: 2022-04-24 | Disposition: A | Discharge status: Home / Self Care | Payer: Medicare HMO | Source: Ambulatory Visit | Attending: Obstetrics and Gynecology | Admitting: Obstetrics and Gynecology

## 2022-04-24 DIAGNOSIS — Z1231 Encounter for screening mammogram for malignant neoplasm of breast: Secondary | ICD-10-CM

## 2022-12-03 ENCOUNTER — Encounter (INDEPENDENT_AMBULATORY_CARE_PROVIDER_SITE_OTHER): Payer: Medicare HMO | Admitting: Ophthalmology

## 2022-12-03 DIAGNOSIS — H43813 Vitreous degeneration, bilateral: Secondary | ICD-10-CM

## 2022-12-03 DIAGNOSIS — H35033 Hypertensive retinopathy, bilateral: Secondary | ICD-10-CM

## 2022-12-03 DIAGNOSIS — H353121 Nonexudative age-related macular degeneration, left eye, early dry stage: Secondary | ICD-10-CM

## 2022-12-03 DIAGNOSIS — I10 Essential (primary) hypertension: Secondary | ICD-10-CM

## 2023-03-22 ENCOUNTER — Other Ambulatory Visit: Payer: Self-pay | Admitting: Obstetrics and Gynecology

## 2023-03-22 DIAGNOSIS — Z1231 Encounter for screening mammogram for malignant neoplasm of breast: Secondary | ICD-10-CM

## 2023-04-26 ENCOUNTER — Ambulatory Visit: Payer: Medicare HMO

## 2023-05-01 ENCOUNTER — Ambulatory Visit: Payer: Medicare HMO

## 2023-05-09 ENCOUNTER — Ambulatory Visit
Admission: RE | Admit: 2023-05-09 | Discharge: 2023-05-09 | Disposition: A | Discharge status: Home / Self Care | Payer: Medicare HMO | Source: Ambulatory Visit | Attending: Obstetrics and Gynecology | Admitting: Obstetrics and Gynecology

## 2023-05-09 DIAGNOSIS — Z1231 Encounter for screening mammogram for malignant neoplasm of breast: Secondary | ICD-10-CM

## 2023-05-15 ENCOUNTER — Other Ambulatory Visit: Payer: Self-pay | Admitting: Obstetrics and Gynecology

## 2023-05-15 DIAGNOSIS — R928 Other abnormal and inconclusive findings on diagnostic imaging of breast: Secondary | ICD-10-CM

## 2023-05-21 ENCOUNTER — Ambulatory Visit
Admission: RE | Admit: 2023-05-21 | Discharge: 2023-05-21 | Disposition: A | Discharge status: Home / Self Care | Source: Ambulatory Visit | Attending: Obstetrics and Gynecology | Admitting: Obstetrics and Gynecology

## 2023-05-21 ENCOUNTER — Ambulatory Visit

## 2023-05-21 DIAGNOSIS — R928 Other abnormal and inconclusive findings on diagnostic imaging of breast: Secondary | ICD-10-CM

## 2023-05-27 ENCOUNTER — Other Ambulatory Visit

## 2023-05-27 ENCOUNTER — Encounter
